# Patient Record
Sex: Male | Born: 1935 | Race: White | Hispanic: No | Marital: Married
Health system: Southern US, Community
[De-identification: ages and names within clinical notes are randomized; demographics above are authoritative.]

## PROBLEM LIST (undated history)

## (undated) DIAGNOSIS — I10 Essential (primary) hypertension: Secondary | ICD-10-CM

## (undated) DIAGNOSIS — I2510 Atherosclerotic heart disease of native coronary artery without angina pectoris: Secondary | ICD-10-CM

## (undated) DIAGNOSIS — Z95820 Peripheral vascular angioplasty status with implants and grafts: Secondary | ICD-10-CM

## (undated) DIAGNOSIS — I951 Orthostatic hypotension: Secondary | ICD-10-CM

## (undated) DIAGNOSIS — F0390 Unspecified dementia without behavioral disturbance: Secondary | ICD-10-CM

## (undated) DIAGNOSIS — S72009A Fracture of unspecified part of neck of unspecified femur, initial encounter for closed fracture: Secondary | ICD-10-CM

## (undated) HISTORY — PX: CORONARY STENT PLACEMENT: SHX1402

---

## 2011-12-22 ENCOUNTER — Emergency Department (HOSPITAL_COMMUNITY)
Admission: EM | Admit: 2011-12-22 | Discharge: 2011-12-22 | Disposition: A | Discharge status: Home / Self Care | Payer: Medicare HMO | Attending: Emergency Medicine | Admitting: Emergency Medicine

## 2011-12-22 ENCOUNTER — Emergency Department (HOSPITAL_COMMUNITY): Payer: Medicare HMO

## 2011-12-22 ENCOUNTER — Encounter (HOSPITAL_COMMUNITY): Payer: Self-pay | Admitting: *Deleted

## 2011-12-22 DIAGNOSIS — Z7982 Long term (current) use of aspirin: Secondary | ICD-10-CM | POA: Insufficient documentation

## 2011-12-22 DIAGNOSIS — I10 Essential (primary) hypertension: Secondary | ICD-10-CM | POA: Insufficient documentation

## 2011-12-22 DIAGNOSIS — Z9889 Other specified postprocedural states: Secondary | ICD-10-CM | POA: Insufficient documentation

## 2011-12-22 DIAGNOSIS — I2510 Atherosclerotic heart disease of native coronary artery without angina pectoris: Secondary | ICD-10-CM | POA: Insufficient documentation

## 2011-12-22 DIAGNOSIS — Z79899 Other long term (current) drug therapy: Secondary | ICD-10-CM | POA: Insufficient documentation

## 2011-12-22 DIAGNOSIS — I4891 Unspecified atrial fibrillation: Secondary | ICD-10-CM | POA: Insufficient documentation

## 2011-12-22 DIAGNOSIS — I480 Paroxysmal atrial fibrillation: Secondary | ICD-10-CM

## 2011-12-22 DIAGNOSIS — R002 Palpitations: Secondary | ICD-10-CM | POA: Insufficient documentation

## 2011-12-22 HISTORY — DX: Atherosclerotic heart disease of native coronary artery without angina pectoris: I25.10

## 2011-12-22 HISTORY — DX: Essential (primary) hypertension: I10

## 2011-12-22 LAB — CBC
MCV: 89.7 fL (ref 78.0–100.0)
Platelets: 209 10*3/uL (ref 150–400)
RBC: 4.45 MIL/uL (ref 4.22–5.81)
RDW: 12.5 % (ref 11.5–15.5)
WBC: 8 10*3/uL (ref 4.0–10.5)

## 2011-12-22 LAB — POCT I-STAT, CHEM 8
Chloride: 106 mEq/L (ref 96–112)
Glucose, Bld: 137 mg/dL — ABNORMAL HIGH (ref 70–99)
HCT: 39 % (ref 39.0–52.0)
Hemoglobin: 13.3 g/dL (ref 13.0–17.0)
Potassium: 3.8 mEq/L (ref 3.5–5.1)
Sodium: 143 mEq/L (ref 135–145)

## 2011-12-22 LAB — DIGOXIN LEVEL: Digoxin Level: <0.3 ng/mL — ABNORMAL LOW (ref 0.8–2.0)

## 2011-12-22 MED ORDER — DILTIAZEM HCL 60 MG PO TABS: 60.0000 mg | ORAL_TABLET | Freq: Once | ORAL | Status: AC

## 2011-12-22 MED ORDER — DILTIAZEM HCL 60 MG PO TABS
60.0000 mg | ORAL_TABLET | Freq: Once | ORAL | Status: AC
  Administered 2011-12-22: 60 mg via ORAL
  Filled 2011-12-22: qty 1

## 2011-12-22 NOTE — ED Notes (Signed)
Pt states he went to bed and felt his heart racing and ears pounding. Denies chest pain, nausea and vomiting. Pt has a history of SVT and ? Afib. Pt from [facility]. Pt denies pain or palpitations at this time. Denies pounding in ears at this time, too. Pt is alert and oriented x 4, neuro intact. Pt NSR on 12 lead per [facility].

## 2011-12-22 NOTE — ED Notes (Signed)
Pt resting quietly with no s/s of any pain or distress observed or reported. Family remains at bedside, and pt is awaiting MD re-evaluation for disposition. Will continue to monitor pt.

## 2011-12-22 NOTE — ED Notes (Signed)
Pt denies any pain or questions upon discharge. 

## 2011-12-22 NOTE — ED Provider Notes (Signed)
History     CSN: [id number]  Arrival date & time 12/22/11  0142   First MD Initiated Contact with Patient 12/22/11 0146      Chief Complaint  Patient presents with  . Palpitations    (Consider location/radiation/quality/duration/timing/severity/associated sxs/prior treatment) HPI history provided by patient. Is from [facility] here visiting family. He has a history of coronary artery disease and hypertension status post coronary stent placement. He has history of paroxysmal atrial fibrillation. He had this one time in the past, went to the hospital and received an IV drip with medication to convert him back to normal sinus rhythm within minutes. He is not on Coumadin or blood thinner other than a baby aspirin. He is prescribed digoxin and Florinef. Tonight when he lay down to sleep developed palpitations that felt the same as his previous episode of atrial fibrillation. The symptoms lasted until he got up and asked family member to call 911. At no time did he have any chest pain or difficulty breathing. He denies any medication changes. No recent illness. No change in diet including caffeinated beverages. He remains asymptomatic in the ED. No diaphoresis. No nausea vomiting. No back pain. No headache, numbness or weakness. Symptoms moderate in severity now resolved. Therapist so that lasted 20-30 minutes. Past Medical History  Diagnosis Date  . Coronary artery disease   . Hypertension     Past Surgical History  Procedure Date  . Coronary stent placement     No family history on file.  History  Substance Use Topics  . Smoking status: Not on file  . Smokeless tobacco: Not on file  . Alcohol Use:       Review of Systems  Constitutional: Negative for fever and chills.  HENT: Negative for neck pain and neck stiffness.   Eyes: Negative for pain.  Respiratory: Negative for shortness of breath.   Cardiovascular: Positive for palpitations. Negative for chest pain.    Gastrointestinal: Negative for abdominal pain.  Genitourinary: Negative for dysuria.  Musculoskeletal: Negative for back pain.  Skin: Negative for rash.  Neurological: Negative for headaches.  All other systems reviewed and are negative.    Allergies  Review of patient's allergies indicates no known allergies.  Home Medications   Current Outpatient Rx  Name  Route  Sig  Dispense  Refill  . ACETAMINOPHEN 500 MG PO TABS   Oral   Take 500-1,000 mg by mouth every 6 (six) hours as needed. For pain         . ASPIRIN EC 81 MG PO TBEC   Oral   Take 81 mg by mouth daily.         Marland Kitchen DIGOXIN 0.125 MG PO TABS   Oral   Take 0.125 mg by mouth daily.         Marland Kitchen FLUDROCORTISONE ACETATE 0.1 MG PO TABS   Oral   Take 0.1 mg by mouth 2 (two) times daily.         Marland Kitchen POTASSIUM CHLORIDE 20 MEQ/15ML (10%) PO LIQD   Oral   Take 10 mEq by mouth daily. 5 ml         . VITAMIN D (ERGOCALCIFEROL) 50000 UNITS PO CAPS   Oral   Take 50,000 Units by mouth daily.           BP 163/110  Pulse 89  Temp 97.7 F (36.5 C) (Oral)  Resp 21  SpO2 100%  Physical Exam  Constitutional: He is oriented to person, place,  and time. He appears well-developed and well-nourished.  HENT:  Head: Normocephalic and atraumatic.  Eyes: Conjunctivae normal and EOM are normal. Pupils are equal, round, and reactive to light.  Neck: Trachea normal. Neck supple. No thyromegaly present.  Cardiovascular: Normal rate, regular rhythm, S1 normal, S2 normal and normal pulses.     No systolic murmur is present   No diastolic murmur is present  Pulses:      Radial pulses are 2+ on the right side, and 2+ on the left side.  Pulmonary/Chest: Effort normal and breath sounds normal. No stridor. He has no wheezes. He has no rhonchi. He has no rales. He exhibits no tenderness.  Abdominal: Soft. Normal appearance and bowel sounds are normal. There is no tenderness. There is no CVA tenderness and negative Murphy's sign.   Musculoskeletal:       BLE:s Calves nontender, no cords or erythema, negative Homans sign  Neurological: He is alert and oriented to person, place, and time. He has normal strength. No cranial nerve deficit or sensory deficit. GCS eye subscore is 4. GCS verbal subscore is 5. GCS motor subscore is 6.  Skin: Skin is warm and dry. No rash noted. He is not diaphoretic.  Psychiatric: His speech is normal.       Cooperative and appropriate    ED Course  Procedures (including critical care time)  Results for orders placed during the hospital encounter of 12/22/11  CBC      Component Value Range   WBC 8.0  4.0 - 10.5 K/uL   RBC 4.45  4.22 - 5.81 MIL/uL   Hemoglobin 13.3  13.0 - 17.0 g/dL   HCT 16.1  09.6 - 04.5 %   MCV 89.7  78.0 - 100.0 fL   MCH 29.9  26.0 - 34.0 pg   MCHC 33.3  30.0 - 36.0 g/dL   RDW 40.9  81.1 - 91.4 %   Platelets 209  150 - 400 K/uL  DIGOXIN LEVEL      Component Value Range   Digoxin Level <0.3 (*) 0.8 - 2.0 ng/mL  POCT I-STAT, CHEM 8      Component Value Range   Sodium 143  135 - 145 mEq/L   Potassium 3.8  3.5 - 5.1 mEq/L   Chloride 106  96 - 112 mEq/L   BUN 21  6 - 23 mg/dL   Creatinine, Ser 7.82  0.50 - 1.35 mg/dL   Glucose, Bld 956 (*) 70 - 99 mg/dL   Calcium, Ion 2.13  0.86 - 1.30 mmol/L   TCO2 23  0 - 100 mmol/L   Hemoglobin 13.3  13.0 - 17.0 g/dL   HCT 57.8  46.9 - 62.9 %   Dg Chest Portable 1 View  12/22/2011  *RADIOLOGY REPORT*  Clinical Data: Palpitations.  PORTABLE CHEST - 1 VIEW  Comparison: None.  Findings: The lungs are well-aerated and clear.  There is no evidence of focal opacification, pleural effusion or pneumothorax. The left costophrenic angle is incompletely imaged on this study.  The cardiomediastinal silhouette is within normal limits.  No acute osseous abnormalities are seen.  IMPRESSION: No acute cardiopulmonary process seen.   Original Report Authenticated By: Tonia Ghent, M.D.     Rhythm strips obtained by EMS reviewed normal  sinus rhythm. Patient states asymptomatic at that time   Date: 12/22/2011  Rate: 79  Rhythm: normal sinus rhythm  QRS Axis: normal  Intervals: normal  ST/T Wave abnormalities: nonspecific ST changes  Conduction Disutrbances:none  Narrative Interpretation:   Old EKG Reviewed: none available  Diltiazem 60 mg given for elevated blood pressure and what sounds like an episode of paroxysmal atrial fibrillation.  Monitored in the emergency department, normal sinus rhythm throughout. Digoxin level noted and patient assures me he takes it daily as prescribed. He states he has had a procedure to evaluate his heart in the past with a history of paroxysmal atrial fibrillation.  He has never been prescribed blood thinners, beta blocker or calcium channel blocker.   3:59 AM patient ambulates and tolerates fluids remains asymptomatic in the ED. He and family are requesting prn prescription for diltiazem to take as needed for any return of symptoms. Strict return precautions verbalizes understood. Patient agrees to followup with his cardiologist on Monday for review of medications and recheck in the clinic. MDM   78 year old male with history of paroxysmal atrial fibrillation presenting with palpitations now resolved. EKG as above demonstrates normal sinus rhythm. Patient evaluated with labs and chest x-ray. Presentation does not suggest ACS. Medications provided as above. Patient remained on cardiac monitor while in the emergency department        Sunnie Nielsen, MD 12/22/11 [phone]

## 2015-12-27 DIAGNOSIS — I951 Orthostatic hypotension: Secondary | ICD-10-CM | POA: Insufficient documentation

## 2016-02-25 DIAGNOSIS — K922 Gastrointestinal hemorrhage, unspecified: Secondary | ICD-10-CM | POA: Insufficient documentation

## 2017-03-09 ENCOUNTER — Other Ambulatory Visit: Payer: Self-pay

## 2017-03-09 ENCOUNTER — Encounter (HOSPITAL_COMMUNITY): Payer: Self-pay | Admitting: Emergency Medicine

## 2017-03-09 ENCOUNTER — Emergency Department (HOSPITAL_COMMUNITY): Payer: Medicare HMO

## 2017-03-09 ENCOUNTER — Inpatient Hospital Stay (HOSPITAL_COMMUNITY)
Admission: EM | Admit: 2017-03-09 | Discharge: 2017-03-20 | DRG: 470 | Disposition: A | Discharge status: Skilled Nursing Facility | Payer: Medicare HMO | Attending: Internal Medicine | Admitting: Internal Medicine

## 2017-03-09 DIAGNOSIS — N179 Acute kidney failure, unspecified: Secondary | ICD-10-CM | POA: Diagnosis present

## 2017-03-09 DIAGNOSIS — D72828 Other elevated white blood cell count: Secondary | ICD-10-CM | POA: Diagnosis present

## 2017-03-09 DIAGNOSIS — Z66 Do not resuscitate: Secondary | ICD-10-CM | POA: Diagnosis present

## 2017-03-09 DIAGNOSIS — D696 Thrombocytopenia, unspecified: Secondary | ICD-10-CM | POA: Diagnosis not present

## 2017-03-09 DIAGNOSIS — E876 Hypokalemia: Secondary | ICD-10-CM | POA: Diagnosis present

## 2017-03-09 DIAGNOSIS — W010XXA Fall on same level from slipping, tripping and stumbling without subsequent striking against object, initial encounter: Secondary | ICD-10-CM | POA: Diagnosis present

## 2017-03-09 DIAGNOSIS — Y92129 Unspecified place in nursing home as the place of occurrence of the external cause: Secondary | ICD-10-CM

## 2017-03-09 DIAGNOSIS — G901 Familial dysautonomia [Riley-Day]: Secondary | ICD-10-CM | POA: Diagnosis present

## 2017-03-09 DIAGNOSIS — S72001A Fracture of unspecified part of neck of right femur, initial encounter for closed fracture: Secondary | ICD-10-CM

## 2017-03-09 DIAGNOSIS — W19XXXA Unspecified fall, initial encounter: Secondary | ICD-10-CM | POA: Diagnosis not present

## 2017-03-09 DIAGNOSIS — I10 Essential (primary) hypertension: Secondary | ICD-10-CM | POA: Diagnosis present

## 2017-03-09 DIAGNOSIS — I2510 Atherosclerotic heart disease of native coronary artery without angina pectoris: Secondary | ICD-10-CM | POA: Diagnosis present

## 2017-03-09 DIAGNOSIS — S72011A Unspecified intracapsular fracture of right femur, initial encounter for closed fracture: Secondary | ICD-10-CM | POA: Diagnosis not present

## 2017-03-09 DIAGNOSIS — D62 Acute posthemorrhagic anemia: Secondary | ICD-10-CM | POA: Diagnosis not present

## 2017-03-09 DIAGNOSIS — F05 Delirium due to known physiological condition: Secondary | ICD-10-CM | POA: Diagnosis present

## 2017-03-09 DIAGNOSIS — Z959 Presence of cardiac and vascular implant and graft, unspecified: Secondary | ICD-10-CM | POA: Diagnosis not present

## 2017-03-09 DIAGNOSIS — F0390 Unspecified dementia without behavioral disturbance: Secondary | ICD-10-CM | POA: Diagnosis present

## 2017-03-09 DIAGNOSIS — R1310 Dysphagia, unspecified: Secondary | ICD-10-CM | POA: Diagnosis present

## 2017-03-09 DIAGNOSIS — Z955 Presence of coronary angioplasty implant and graft: Secondary | ICD-10-CM | POA: Diagnosis not present

## 2017-03-09 DIAGNOSIS — Z96649 Presence of unspecified artificial hip joint: Secondary | ICD-10-CM

## 2017-03-09 DIAGNOSIS — I951 Orthostatic hypotension: Secondary | ICD-10-CM | POA: Diagnosis not present

## 2017-03-09 DIAGNOSIS — S72009A Fracture of unspecified part of neck of unspecified femur, initial encounter for closed fracture: Secondary | ICD-10-CM | POA: Diagnosis not present

## 2017-03-09 DIAGNOSIS — I471 Supraventricular tachycardia, unspecified: Secondary | ICD-10-CM | POA: Insufficient documentation

## 2017-03-09 DIAGNOSIS — M25551 Pain in right hip: Secondary | ICD-10-CM | POA: Diagnosis present

## 2017-03-09 DIAGNOSIS — Z7982 Long term (current) use of aspirin: Secondary | ICD-10-CM

## 2017-03-09 DIAGNOSIS — S72001D Fracture of unspecified part of neck of right femur, subsequent encounter for closed fracture with routine healing: Secondary | ICD-10-CM | POA: Diagnosis not present

## 2017-03-09 DIAGNOSIS — Z95820 Peripheral vascular angioplasty status with implants and grafts: Secondary | ICD-10-CM

## 2017-03-09 HISTORY — DX: Orthostatic hypotension: I95.1

## 2017-03-09 HISTORY — DX: Fracture of unspecified part of neck of unspecified femur, initial encounter for closed fracture: S72.009A

## 2017-03-09 HISTORY — DX: Atherosclerotic heart disease of native coronary artery without angina pectoris: I25.10

## 2017-03-09 HISTORY — DX: Peripheral vascular angioplasty status with implants and grafts: Z95.820

## 2017-03-09 HISTORY — DX: Essential (primary) hypertension: I10

## 2017-03-09 HISTORY — DX: Unspecified dementia, unspecified severity, without behavioral disturbance, psychotic disturbance, mood disturbance, and anxiety: F03.90

## 2017-03-09 HISTORY — DX: Unspecified dementia without behavioral disturbance: F03.90

## 2017-03-09 LAB — CBC WITH DIFFERENTIAL/PLATELET
Basophils Absolute: 0 10*3/uL (ref 0.0–0.1)
Basophils Relative: 0 %
EOS ABS: 0 10*3/uL (ref 0.0–0.7)
Eosinophils Relative: 0 %
HEMATOCRIT: 34.2 % — AB (ref 39.0–52.0)
HEMOGLOBIN: 11.2 g/dL — AB (ref 13.0–17.0)
LYMPHS ABS: 0.8 10*3/uL (ref 0.7–4.0)
LYMPHS PCT: 13 %
MCH: 31 pg (ref 26.0–34.0)
MCHC: 32.7 g/dL (ref 30.0–36.0)
MCV: 94.7 fL (ref 78.0–100.0)
MONOS PCT: 9 %
Monocytes Absolute: 0.6 10*3/uL (ref 0.1–1.0)
NEUTROS ABS: 5 10*3/uL (ref 1.7–7.7)
NEUTROS PCT: 78 %
Platelets: 184 10*3/uL (ref 150–400)
RBC: 3.61 MIL/uL — ABNORMAL LOW (ref 4.22–5.81)
RDW: 13 % (ref 11.5–15.5)
WBC: 6.4 10*3/uL (ref 4.0–10.5)

## 2017-03-09 LAB — TYPE AND SCREEN
ABO/RH(D): B POS
ANTIBODY SCREEN: NEGATIVE

## 2017-03-09 LAB — PROTIME-INR
INR: 0.99
Prothrombin Time: 13 seconds (ref 11.4–15.2)

## 2017-03-09 LAB — BASIC METABOLIC PANEL
Anion gap: 10 (ref 5–15)
BUN: 19 mg/dL (ref 6–20)
CHLORIDE: 107 mmol/L (ref 101–111)
CO2: 25 mmol/L (ref 22–32)
Calcium: 9.1 mg/dL (ref 8.9–10.3)
Creatinine, Ser: 1 mg/dL (ref 0.61–1.24)
GFR calc Af Amer: >60 mL/min (ref >60)
GFR calc non Af Amer: >60 mL/min (ref >60)
Glucose, Bld: 88 mg/dL (ref 65–99)
Potassium: 3.4 mmol/L — ABNORMAL LOW (ref 3.5–5.1)
SODIUM: 142 mmol/L (ref 135–145)

## 2017-03-09 LAB — SURGICAL PCR SCREEN
MRSA, PCR: NEGATIVE
Staphylococcus aureus: NEGATIVE

## 2017-03-09 LAB — ABO/RH: ABO/RH(D): B POS

## 2017-03-09 MED ORDER — MELATONIN 3 MG PO TABS
3.0000 mg | ORAL_TABLET | Freq: Every day | ORAL | Status: DC
  Administered 2017-03-09 – 2017-03-19 (×11): 3 mg via ORAL
  Filled 2017-03-09 (×11): qty 1

## 2017-03-09 MED ORDER — CHLORHEXIDINE GLUCONATE 4 % EX LIQD: 60.0000 mL | Freq: Once | CUTANEOUS | Status: DC

## 2017-03-09 MED ORDER — MIRTAZAPINE 15 MG PO TABS
15.0000 mg | ORAL_TABLET | Freq: Every day | ORAL | Status: DC
  Administered 2017-03-09 – 2017-03-13 (×5): 15 mg via ORAL
  Filled 2017-03-09 (×5): qty 1

## 2017-03-09 MED ORDER — POVIDONE-IODINE 10 % EX SWAB: 2.0000 "application " | Freq: Once | CUTANEOUS | Status: DC

## 2017-03-09 MED ORDER — HYDROMORPHONE HCL 1 MG/ML IJ SOLN
0.5000 mg | Freq: Once | INTRAMUSCULAR | Status: AC
  Administered 2017-03-09: 0.5 mg via INTRAVENOUS
  Filled 2017-03-09: qty 1

## 2017-03-09 MED ORDER — HYDROCODONE-ACETAMINOPHEN 5-325 MG PO TABS: 1.0000 | ORAL_TABLET | ORAL | Status: DC | PRN

## 2017-03-09 MED ORDER — METHOCARBAMOL 1000 MG/10ML IJ SOLN
500.0000 mg | Freq: Once | INTRAMUSCULAR | Status: DC
  Filled 2017-03-09: qty 5

## 2017-03-09 MED ORDER — CEFAZOLIN SODIUM-DEXTROSE 2-4 GM/100ML-% IV SOLN
2.0000 g | INTRAVENOUS | Status: AC
  Administered 2017-03-10: 2 g via INTRAVENOUS
  Filled 2017-03-09: qty 100

## 2017-03-09 MED ORDER — SODIUM CHLORIDE 0.9 % IV SOLN
INTRAVENOUS | Status: DC
  Administered 2017-03-09 – 2017-03-10 (×2): via INTRAVENOUS

## 2017-03-09 MED ORDER — POLYETHYLENE GLYCOL 3350 17 G PO PACK: 17.0000 g | PACK | Freq: Every day | ORAL | Status: DC | PRN

## 2017-03-09 MED ORDER — FENTANYL CITRATE (PF) 100 MCG/2ML IJ SOLN
50.0000 ug | Freq: Once | INTRAMUSCULAR | Status: AC
Start: 2017-03-09 — End: 2017-03-09
  Administered 2017-03-09: 50 ug via INTRAVENOUS
  Filled 2017-03-09: qty 2

## 2017-03-09 MED ORDER — ACETAMINOPHEN 325 MG PO TABS
650.0000 mg | ORAL_TABLET | Freq: Four times a day (QID) | ORAL | Status: DC | PRN
  Administered 2017-03-09 – 2017-03-19 (×10): 650 mg via ORAL
  Filled 2017-03-09 (×10): qty 2

## 2017-03-09 MED ORDER — SORBITOL 70 % SOLN
30.0000 mL | Freq: Every day | Status: DC | PRN
  Filled 2017-03-09: qty 30

## 2017-03-09 MED ORDER — ONDANSETRON HCL 4 MG/2ML IJ SOLN
4.0000 mg | Freq: Four times a day (QID) | INTRAMUSCULAR | Status: DC | PRN
Start: 2017-03-09 — End: 2017-03-20

## 2017-03-09 MED ORDER — HYDROCORTISONE NA SUCCINATE PF 100 MG IJ SOLR
50.0000 mg | Freq: Three times a day (TID) | INTRAMUSCULAR | Status: DC
  Administered 2017-03-09 – 2017-03-11 (×3): 50 mg via INTRAVENOUS
  Filled 2017-03-09 (×6): qty 1

## 2017-03-09 MED ORDER — FLUDROCORTISONE ACETATE 0.1 MG PO TABS
0.2000 mg | ORAL_TABLET | Freq: Every day | ORAL | Status: DC
  Administered 2017-03-10 – 2017-03-11 (×2): 0.2 mg via ORAL
  Filled 2017-03-09 (×5): qty 2

## 2017-03-09 MED ORDER — SODIUM CHLORIDE 0.9% FLUSH
3.0000 mL | Freq: Two times a day (BID) | INTRAVENOUS | Status: DC
  Administered 2017-03-09 – 2017-03-20 (×16): 3 mL via INTRAVENOUS

## 2017-03-09 MED ORDER — ACETAMINOPHEN 500 MG PO TABS
1000.0000 mg | ORAL_TABLET | ORAL | Status: AC
  Administered 2017-03-10: 1000 mg via ORAL
  Filled 2017-03-09: qty 2

## 2017-03-09 MED ORDER — ENOXAPARIN SODIUM 40 MG/0.4ML ~~LOC~~ SOLN
40.0000 mg | SUBCUTANEOUS | Status: DC
  Administered 2017-03-10 – 2017-03-19 (×10): 40 mg via SUBCUTANEOUS
  Filled 2017-03-09 (×10): qty 0.4

## 2017-03-09 MED ORDER — CEFAZOLIN SODIUM-DEXTROSE 2-4 GM/100ML-% IV SOLN: 2.0000 g | INTRAVENOUS | Status: DC

## 2017-03-09 MED ORDER — ACETAMINOPHEN 650 MG RE SUPP: 650.0000 mg | Freq: Four times a day (QID) | RECTAL | Status: DC | PRN

## 2017-03-09 MED ORDER — ASPIRIN EC 81 MG PO TBEC
81.0000 mg | DELAYED_RELEASE_TABLET | Freq: Every day | ORAL | Status: DC
Start: 2017-03-10 — End: 2017-03-20
  Administered 2017-03-11 – 2017-03-20 (×7): 81 mg via ORAL
  Filled 2017-03-09 (×9): qty 1

## 2017-03-09 MED ORDER — ACETAMINOPHEN 500 MG PO TABS: 1000.0000 mg | ORAL_TABLET | Freq: Once | ORAL | Status: DC

## 2017-03-09 MED ORDER — MEMANTINE HCL 10 MG PO TABS
5.0000 mg | ORAL_TABLET | Freq: Every day | ORAL | Status: DC
  Administered 2017-03-09 – 2017-03-19 (×11): 5 mg via ORAL
  Filled 2017-03-09 (×11): qty 1

## 2017-03-09 MED ORDER — KETOROLAC TROMETHAMINE 15 MG/ML IJ SOLN
15.0000 mg | Freq: Four times a day (QID) | INTRAMUSCULAR | Status: DC | PRN
Start: 2017-03-09 — End: 2017-03-14
  Administered 2017-03-09 – 2017-03-12 (×3): 15 mg via INTRAVENOUS
  Filled 2017-03-09 (×3): qty 1

## 2017-03-09 MED ORDER — ONDANSETRON HCL 4 MG PO TABS: 4.0000 mg | ORAL_TABLET | Freq: Four times a day (QID) | ORAL | Status: DC | PRN

## 2017-03-09 NOTE — Plan of Care (Signed)
Pt has dementia no family present

## 2017-03-09 NOTE — Progress Notes (Signed)
Spoke with [name] Ignacia PalmaDavidson patient niece and POA. Phone consent done with L Epperson RN for surgery 2/15 and for blood products if needed.  Sharrell Ku Jairo Bellew RN

## 2017-03-09 NOTE — ED Notes (Signed)
Pt in xray

## 2017-03-09 NOTE — ED Notes (Signed)
Bed: WA19 Expected date:  Expected time:  Means of arrival:  Comments: EMS fall/hip pain 

## 2017-03-09 NOTE — ED Provider Notes (Addendum)
[facility]-EMERGENCY DEPT Provider Note  CSN: [id number] Arrival date & time: 03/09/17 4098  Chief Complaint(s) Hip Pain and Fall  HPI Joe Mcpherson is a 84 y.o. male with a history of hypertension CAD with stenting who presents to the emergency department from skilled nursing facility by EMS after mechanical fall resulting in right hip pain.  Patient states that he slipped causing him to fall right onto his right hip.  Denies any head trauma or other injuries.  Currently complaining of right hip pain exacerbated with movement and palpation.  Had some mild relief in route when EMS provided the patient with fentanyl.  No other alleviating or aggravating factors.  Patient denies any headache, neck pain, back pain.  No other extremity pain.  No abdominal pain or chest pain.  Denies any other physical complaints.  HPI  Past Medical History Past Medical History:  Diagnosis Date  . Coronary artery disease   . Hypertension    There are no active problems to display for this patient.  Home Medication(s) Prior to Admission medications   Medication Sig Start Date End Date Taking? Authorizing Provider  aspirin EC 81 MG tablet Take 81 mg by mouth daily.   Yes [provider]  fludrocortisone (FLORINEF) 0.1 MG tablet Take 0.2 mg by mouth daily.    Yes [provider]  Melatonin 3 MG TABS Take 3 mg by mouth at bedtime.   Yes [provider]  memantine (NAMENDA) 5 MG tablet Take 5 mg by mouth at bedtime.   Yes [provider]  mirtazapine (REMERON) 15 MG tablet Take 15 mg by mouth at bedtime.   Yes [provider]  potassium chloride (K-DUR) 10 MEQ tablet Take 10 mEq by mouth daily.   Yes [provider]  diltiazem (CARDIZEM) 60 MG tablet Take 1 tablet (60 mg total) by mouth once. Patient not taking: Reported on 03/09/2017 12/22/11   Sunnie Nielsen, MD                                                       Past Surgical History Past Surgical History:  Procedure Laterality Date  . CORONARY STENT PLACEMENT     Family History No family history on file.  Social History Social History   Tobacco Use  . Smoking status: Not on file  Substance Use Topics  . Alcohol use: Not on file  . Drug use: Not on file   Allergies Patient has no known allergies.  Review of Systems Review of Systems All other systems are reviewed and are negative for acute change except as noted in the HPI  Physical Exam Vital Signs  I have reviewed the triage vital signs BP (!) 184/96 (BP Location: Left Arm)   Pulse 82   Temp (!) 97.4 F (36.3 C) (Oral)   Resp (!) 26   SpO2 97%   Physical Exam  Constitutional: He is oriented to person, place, and time. He appears well-developed and well-nourished. No distress.  HENT:  Head: Normocephalic.  Right Ear: External ear normal.  Left Ear: External ear normal.  Mouth/Throat: Oropharynx is clear and moist.  Eyes: Conjunctivae and EOM are normal. Pupils are equal, round, and reactive to light. Right eye exhibits no discharge. Left eye exhibits no discharge. No scleral icterus.  Neck: Normal range of  motion. Neck supple.  Cardiovascular: Regular rhythm and normal heart sounds. Exam reveals no gallop and no friction rub.  No murmur heard. Pulses:      Radial pulses are 2+ on the right side, and 2+ on the left side.       Dorsalis pedis pulses are 1+ on the right side, and 1+ on the left side.  Pulmonary/Chest: Effort normal and breath sounds normal. No stridor. No respiratory distress.  Abdominal: Soft. He exhibits no distension. There is no tenderness.  Musculoskeletal:       Right hip: He exhibits decreased range of motion, bony tenderness and deformity (internally rotated).       Cervical back: He exhibits no bony tenderness.       Thoracic back: He exhibits no bony tenderness.       Lumbar back: He exhibits  no bony tenderness.  Clavicle stable. Chest stable to AP/Lat compression. Pelvis stable to Lat compression.   Neurological: He is alert and oriented to person, place, and time. GCS eye subscore is 4. GCS verbal subscore is 5. GCS motor subscore is 6.  Moving all extremities   Skin: Skin is warm. He is not diaphoretic.    ED Results and Treatments Labs (all labs ordered are listed, but only abnormal results are displayed) Labs Reviewed  CBC WITH DIFFERENTIAL/PLATELET - Abnormal; Notable for the following components:      Result Value   RBC 3.61 (*)    Hemoglobin 11.2 (*)    HCT 34.2 (*)    All other components within normal limits  BASIC METABOLIC PANEL - Abnormal; Notable for the following components:   Potassium 3.4 (*)    All other components within normal limits  PROTIME-INR                                                                                                                         EKG  EKG Interpretation  Date/Time:    Ventricular Rate:    PR Interval:    QRS Duration:   QT Interval:    QTC Calculation:   R Axis:     Text Interpretation:        Radiology Dg Chest 1 View  Result Date: 03/09/2017 CLINICAL DATA:  The patient slipped and fell today and has diffuse right hip pain. EXAM: CHEST 1 VIEW COMPARISON:  Portable chest x-ray of December 22, 2011 FINDINGS: The lungs are hyperinflated. There is no focal infiltrate. The lung markings are increased however in the right upper lobe. There is scleroses of the posterior aspect of the right fourth rib which has progressed since the previous study. The heart is top-normal in size. The pulmonary vascularity is normal. There tortuosity of the descending thoracic aorta. No acute rib fracture is observed. IMPRESSION: COPD. Slightly increased right upper lobe lung markings as compared to the previous study may reflect atelectasis or early pneumonia. No CHF. Increased density within the posterior aspect of the right  fourth rib.  Thoracic aortic atherosclerosis. Electronically Signed   By: David  SwazilandJordan M.D.   On: 03/09/2017 10:25   Dg Hip Unilat  With Pelvis 2-3 Views Right  Result Date: 03/09/2017 CLINICAL DATA:  Status post fall today with diffuse right hip pain. EXAM: DG HIP (WITH OR WITHOUT PELVIS) 2-3V RIGHT COMPARISON:  None in PACs FINDINGS: Patient has sustained an acute subcapital fracture of the right hip. The right femoral head remains appropriately position with respect to the acetabulum. There is mild right hip joint space narrowing. The intertrochanteric and immediate subtrochanteric regions of the right hip are intact. 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 IMPRESSION: There is an acute subcapital fracture of the right hip. Electronically Signed   By: David  [place]azil[place]Jordan M.D.   On: 03/09/2017 10:26   Pertinent labs & imaging results that were available during my care of the patient were reviewed by me and considered in my medical decision making (see chart for details).  Medications Ordered in ED Medications  HYDROmorphone (DILAUDID) injection 0.5 mg (not administered)  methocarbamol (ROBAXIN) injection 500 mg (not administered)  fentaNYL (SUBLIMAZE) injection 50 mcg (50 mcg Intravenous Given 03/09/17 1045)                                                                                                                                    Procedures Procedures  (including critical care time)  Medical Decision Making / ED Course I have reviewed the nursing notes for this encounter and the patient's prior records (if available in EHR or on provided paperwork).    Mechanical fall resulting in right subcapital femoral neck fracture.  Patient has had a history of left hip fracture that was [place] in [place].  No other injuries noted on exam requiring imaging.  Screening labs are obtained.  Case was discussed with hospitalist will admit the patient for medical clearance.  Consulted  with Dr. Renaye Rakersim Murphy who [facility].  Final Clinical Impression(s) / ED Diagnoses Final diagnoses:  Fall, initial encounter  Closed displaced fracture of right femoral neck (HCC)      This chart was dictated using voice recognition software.  Despite best efforts to proofread,  errors can occur which can change the documentation meaning.     Nira Connardama, Tonya Wantz Eduardo, MD 03/09/17 1346

## 2017-03-09 NOTE — Consult Note (Signed)
Reason for Consult:Right hip fx Referring Physician: S Purohit  Joe Mcpherson is an 84 y.o. male.  HPI: Joe Mcpherson had a mechanical fall and was diagnosed with a right femoral neck fracture. Orthopedic surgery was consulted. He will not/cannot speak to me much like he didn't to admitting MD so history gleaned from chart. Unsure if this is baseline.  Past Medical History:  Diagnosis Date  . CAD (coronary artery disease) 03/09/2017  . Coronary artery disease   . Dementia 03/09/2017  . Hip fracture, unspecified laterality, closed, initial encounter ([place]) 03/09/2017  . HTN (hypertension) 03/09/2017  . Hypertension   . Orthostasis 03/09/2017  . S/P angioplasty with stent 03/09/2017    Past Surgical History:  Procedure Laterality Date  . CORONARY STENT PLACEMENT      No family history on file.  Social History:  has no tobacco, alcohol, and drug history on file.  Allergies: No Known Allergies  Medications: I have reviewed the patient's current medications.  Results for orders placed or performed during the hospital encounter of 03/09/17 (from the past 48 hour(s))  CBC with Differential/Platelet     Status: Abnormal   Collection Time: 03/09/17 10:38 AM  Result Value Ref Range   WBC 6.4 4.0 - 10.5 K/uL   RBC 3.61 (L) 4.22 - 5.81 MIL/uL   Hemoglobin 11.2 (L) 13.0 - 17.0 g/dL   HCT 34.2 (L) 39.0 - 52.0 %   MCV 94.7 78.0 - 100.0 fL   MCH 31.0 26.0 - 34.0 pg   MCHC 32.7 30.0 - 36.0 g/dL   RDW 13.0 11.5 - 15.5 %   Platelets 184 150 - 400 K/uL   Neutrophils Relative % 78 %   Neutro Abs 5.0 1.7 - 7.7 K/uL   Lymphocytes Relative 13 %   Lymphs Abs 0.8 0.7 - 4.0 K/uL   Monocytes Relative 9 %   Monocytes Absolute 0.6 0.1 - 1.0 K/uL   Eosinophils Relative 0 %   Eosinophils Absolute 0.0 0.0 - 0.7 K/uL   Basophils Relative 0 %   Basophils Absolute 0.0 0.0 - 0.1 K/uL    Comment: Performed at [facility], [place] [address], [place], [place] [address]  Basic metabolic panel      Status: Abnormal   Collection Time: 03/09/17 10:38 AM  Result Value Ref Range   Sodium 142 135 - 145 mmol/L   Potassium 3.4 (L) 3.5 - 5.1 mmol/L   Chloride 107 101 - 111 mmol/L   CO2 25 22 - 32 mmol/L   Glucose, Bld 88 65 - 99 mg/dL   BUN 19 6 - 20 mg/dL   Creatinine, Ser 1.00 0.61 - 1.24 mg/dL   Calcium 9.1 8.9 - 10.3 mg/dL   GFR calc non Af Amer >60 >60 mL/min   GFR calc Af Amer >60 >60 mL/min    Comment: (NOTE) The eGFR has been calculated using the CKD EPI equation. This calculation has not been validated in all clinical situations. eGFR's persistently <60 mL/min signify possible Chronic Kidney Disease.    Anion gap 10 5 - 15    Comment: Performed at [facility], [place] [address], [place], [place] [address]  Protime-INR     Status: None   Collection Time: 03/09/17 12:14 PM  Result Value Ref Range   Prothrombin Time 13.0 11.4 - 15.2 seconds   INR 0.99     Comment: Performed at [facility], [place] [address] [place], [place] [address]    Dg Chest  1 View  Result Date: 03/09/2017 CLINICAL DATA:  The patient slipped and fell today and has diffuse right hip pain. EXAM: CHEST 1 VIEW COMPARISON:  Portable chest x-ray of December 22, 2011 FINDINGS: The lungs are hyperinflated. There is no focal infiltrate. The lung markings are increased however in the right upper lobe. There is scleroses of the posterior aspect of the right fourth rib which has progressed since the previous study. The heart is top-normal in size. The pulmonary vascularity is normal. There tortuosity of the descending thoracic aorta. No acute rib fracture is observed. IMPRESSION: COPD. Slightly increased right upper lobe lung markings as compared to the previous study may reflect atelectasis or early pneumonia. No CHF. Increased density within the posterior aspect of the right fourth rib. Thoracic aortic atherosclerosis. Electronically Signed   By: David  Martinique M.D.   On: 03/09/2017  10:25   Dg Hip Unilat  With Pelvis 2-3 Views Right  Result Date: 03/09/2017 CLINICAL DATA:  Status post fall today with diffuse right hip pain. EXAM: DG HIP (WITH OR WITHOUT PELVIS) 2-3V RIGHT COMPARISON:  None in PACs FINDINGS: Patient has sustained an acute subcapital fracture of the right hip. The right femoral head remains appropriately position with respect to the acetabulum. There is mild right hip joint space narrowing. The intertrochanteric and immediate subtrochanteric regions of the right hip are intact. 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 IMPRESSION: There is an acute subcapital fracture of the right hip. Electronically Signed   By: David  Martinique M.D.   On: 03/09/2017 10:26    Review of Systems  Unable to perform ROS: Patient nonverbal   Blood pressure (!) 173/85, pulse 87, temperature (!) 97.4 F (36.3 C), temperature source Oral, resp. rate 18, SpO2 93 %. Physical Exam  Constitutional: He appears well-developed and well-nourished. No distress.  HENT:  Head: Normocephalic and atraumatic.  Eyes: Conjunctivae are normal. Right eye exhibits no discharge. Left eye exhibits no discharge. No scleral icterus.  Neck: Normal range of motion.  Cardiovascular: Normal rate and regular rhythm.  Respiratory: Effort normal. No respiratory distress.  Musculoskeletal:  BLE No traumatic wounds, ecchymosis, or rash  Nontender but grimaces with movement of right leg  No knee or ankle effusion  Knee stable to varus/ valgus and anterior/posterior stress  Sens DPN, SPN, TN could not assess  Motor EHL, ext, flex, evers could not assess  DP 2+, PT 0, No significant edema  Neurological: He is alert.  Skin: Skin is warm and dry. He is not diaphoretic.  Psychiatric: He has a normal mood and affect. His behavior is normal.    Assessment/Plan: Fall Right femoral neck fx -- Likely hemiarthroplasty tomorrow at [facility] with Dr. Percell Miller. NPO after MN. Multiple medical problems  -- per primary team    Lisette Abu, PA-C Orthopedic Surgery [phone] 03/09/2017, 2:08 PM

## 2017-03-09 NOTE — ED Triage Notes (Addendum)
[facility] complaint of right hip pain post fall; shortening and rotation noted. 100 mcg of fentanyl given en route with EMS; pain now 3/10. C collar in place.

## 2017-03-09 NOTE — ED Notes (Signed)
ED TO INPATIENT HANDOFF REPORT  Name/Age/Gender Joe Mcpherson 84 y.o. male  Code Status Code Status History    This patient does not have a recorded code status. Please follow your organizational policy for patients in this situation.      Home/SNF/Other Skilled nursing facility  Chief Complaint fall / hip pain   Level of Care/Admitting Diagnosis ED Disposition    ED Disposition Condition Comment   Admit  Hospital Area: [facility] [[id number]]  Level of Care: Med-Surg [16]  Diagnosis: Hip fracture requiring operative repair [facility]) [[id number]]  Admitting Physician: Cristy Folks [[id number]]  Attending Physician: Cristy Folks [phone]  Estimated length of stay: past midnight tomorrow  Certification:: I certify this patient will need inpatient services for at least 2 midnights  PT Class (Do Not Modify): Inpatient [101]  PT Acc Code (Do Not Modify): Private [1]       Medical History Past Medical History:  Diagnosis Date  . CAD (coronary artery disease) 03/09/2017  . Coronary artery disease   . Dementia 03/09/2017  . Hip fracture, unspecified laterality, closed, initial encounter ([place]) 03/09/2017  . HTN (hypertension) 03/09/2017  . Hypertension   . Orthostasis 03/09/2017  . S/P angioplasty with stent 03/09/2017    Allergies No Known Allergies  IV Location/Drains/Wounds Patient Lines/Drains/Airways Status   Active Line/Drains/Airways    Name:   Placement date:   Placement time:   Site:   Days:   Peripheral IV 12/22/11 Left Antecubital   12/22/11    0157    Antecubital   1904          Labs/Imaging Results for orders placed or performed during the hospital encounter of 03/09/17 (from the past 48 hour(s))  CBC with Differential/Platelet     Status: Abnormal   Collection Time: 03/09/17 10:38 AM  Result Value Ref Range   WBC 6.4 4.0 - 10.5 K/uL   RBC 3.61 (L) 4.22 - 5.81 MIL/uL   Hemoglobin 11.2 (L) 13.0 - 17.0 g/dL   HCT 34.2 (L) 39.0 - 52.0 %   MCV 94.7 78.0 - 100.0 fL   MCH 31.0 26.0 - 34.0 pg   MCHC 32.7 30.0 - 36.0 g/dL   RDW 13.0 11.5 - 15.5 %   Platelets 184 150 - 400 K/uL   Neutrophils Relative % 78 %   Neutro Abs 5.0 1.7 - 7.7 K/uL   Lymphocytes Relative 13 %   Lymphs Abs 0.8 0.7 - 4.0 K/uL   Monocytes Relative 9 %   Monocytes Absolute 0.6 0.1 - 1.0 K/uL   Eosinophils Relative 0 %   Eosinophils Absolute 0.0 0.0 - 0.7 K/uL   Basophils Relative 0 %   Basophils Absolute 0.0 0.0 - 0.1 K/uL    Comment: Performed at [facility], [place] [address], [place],  [address]  Basic metabolic panel     Status: Abnormal   Collection Time: 03/09/17 10:38 AM  Result Value Ref Range   Sodium 142 135 - 145 mmol/L   Potassium 3.4 (L) 3.5 - 5.1 mmol/L   Chloride 107 101 - 111 mmol/L   CO2 25 22 - 32 mmol/L   Glucose, Bld 88 65 - 99 mg/dL   BUN 19 6 - 20 mg/dL   Creatinine, Ser 1.00 0.61 - 1.24 mg/dL   Calcium 9.1 8.9 - 10.3 mg/dL   GFR calc non Af Amer >60 >60 mL/min   GFR calc Af Amer >60 >60 mL/min    Comment: (NOTE) The  eGFR has been calculated using the CKD EPI equation. This calculation has not been validated in all clinical situations. eGFR's persistently <60 mL/min signify possible Chronic Kidney Disease.    Anion gap 10 5 - 15    Comment: Performed at [facility], [place] [address] [place], [place] [address]  Protime-INR     Status: None   Collection Time: 03/09/17 12:14 PM  Result Value Ref Range   Prothrombin Time 13.0 11.4 - 15.2 seconds   INR 0.99     Comment: Performed at [facility], [place] [address], [place], [place] [address]   Dg Chest 1 View  Result Date: 03/09/2017 CLINICAL DATA:  The patient slipped and fell today and has diffuse right hip pain. EXAM: CHEST 1 VIEW COMPARISON:  Portable chest x-ray of December 22, 2011 FINDINGS: The lungs are hyperinflated. There is no focal infiltrate. The lung markings are increased however in the right upper  lobe. There is scleroses of the posterior aspect of the right fourth rib which has progressed since the previous study. The heart is top-normal in size. The pulmonary vascularity is normal. There tortuosity of the descending thoracic aorta. No acute rib fracture is observed. IMPRESSION: COPD. Slightly increased right upper lobe lung markings as compared to the previous study may reflect atelectasis or early pneumonia. No CHF. Increased density within the posterior aspect of the right fourth rib. Thoracic aortic atherosclerosis. Electronically Signed   By: David  Martinique M.D.   On: 03/09/2017 10:25   Dg Hip Unilat  With Pelvis 2-3 Views Right  Result Date: 03/09/2017 CLINICAL DATA:  Status post fall today with diffuse right hip pain. EXAM: DG HIP (WITH OR WITHOUT PELVIS) 2-3V RIGHT COMPARISON:  None in PACs FINDINGS: Patient has sustained an acute subcapital fracture of the right hip. The right femoral head remains appropriately position with respect to the acetabulum. There is mild right hip joint space narrowing. The intertrochanteric and immediate subtrochanteric regions of the right hip are intact. 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 IMPRESSION: There is an acute subcapital fracture of the right hip. Electronically Signed   By: David  Martinique M.D.   On: 03/09/2017 10:26    Pending Labs [employer] (From admission, onward)   Start     Ordered   Signed and Held  CBC  Tomorrow morning,   R     Signed and Held   Signed and Held  Protime-INR  Tomorrow morning,   R     Signed and Held   Signed and Held  APTT  Tomorrow morning,   R     Signed and Held   Signed and Held  Basic metabolic panel  Tomorrow morning,   R     Signed and Held      Vitals/Pain Today's Vitals   03/09/17 1223 03/09/17 1249 03/09/17 1249 03/09/17 1330  BP: (!) 156/119   (!) 173/85  Pulse: 93 94  87  Resp: _0 Temp:      TempSrc:      SpO2: 90% 93%  93%  PainSc:   Asleep     Isolation  Precautions No active isolations  Medications Medications  methocarbamol (ROBAXIN) injection 500 mg (not administered)  fentaNYL (SUBLIMAZE) injection 50 mcg (50 mcg Intravenous Given 03/09/17 1045)  HYDROmorphone (DILAUDID) injection 0.5 mg (0.5 mg Intravenous Given 03/09/17 1214)    Mobility walks with device

## 2017-03-09 NOTE — Progress Notes (Signed)
PT Cancellation Note  Patient Details Name: Joe SignsRobert Mcpherson MRN: [id number] DOB: 15-Dec-1924   Cancelled Treatment:     PT order received but eval deferred - Likely [facility]arthroplasty tomorrow at [facility] with Dr. Eulah PontMurphy.  Please reorder post op.     Ona Rathert 03/09/2017, 4:01 PM

## 2017-03-09 NOTE — H&P (View-Only) (Signed)
Reason for Consult:Right hip fx Referring Physician: S Purohit  Joe Mcpherson is an 84 y.o. male.  HPI: Joe Mcpherson had a mechanical fall and was diagnosed with a right femoral neck fracture. Orthopedic surgery was consulted. He will not/cannot speak to me much like he didn't to admitting MD so history gleaned from chart. Unsure if this is baseline.  Past Medical History:  Diagnosis Date  . CAD (coronary artery disease) 03/09/2017  . Coronary artery disease   . Dementia 03/09/2017  . Hip fracture, unspecified laterality, closed, initial encounter (H[facility]) 03/09/2017  . HTN (hypertension) 03/09/2017  . Hypertension   . Orthostasis 03/09/2017  . S/P angioplasty with stent 03/09/2017    Past Surgical History:  Procedure Laterality Date  . CORONARY STENT PLACEMENT      No family history on file.  Social History:  has no tobacco, alcohol, and drug history on file.  Allergies: No Known Allergies  Medications: I have reviewed the patient's current medications.  Results for orders placed or performed during the hospital encounter of 03/09/17 (from the past 48 hour(s))  CBC with Differential/Platelet     Status: Abnormal   Collection Time: 03/09/17 10:38 AM  Result Value Ref Range   WBC 6.4 4.0 - 10.5 K/uL   RBC 3.61 (L) 4.22 - 5.81 MIL/uL   Hemoglobin 11.2 (L) 13.0 - 17.0 g/dL   HCT 34.2 (L) 39.0 - 52.0 %   MCV 94.7 78.0 - 100.0 fL   MCH 31.0 26.0 - 34.0 pg   MCHC 32.7 30.0 - 36.0 g/dL   RDW 13.0 11.5 - 15.5 %   Platelets 184 150 - 400 K/uL   Neutrophils Relative % 78 %   Neutro Abs 5.0 1.7 - 7.7 K/uL   Lymphocytes Relative 13 %   Lymphs Abs 0.8 0.7 - 4.0 K/uL   Monocytes Relative 9 %   Monocytes Absolute 0.6 0.1 - 1.0 K/uL   Eosinophils Relative 0 %   Eosinophils Absolute 0.0 0.0 - 0.7 K/uL   Basophils Relative 0 %   Basophils Absolute 0.0 0.0 - 0.1 K/uL    Comment: Performed at [facility], [address] [place], [place] [address]  Basic metabolic panel      Status: Abnormal   Collection Time: 03/09/17 10:38 AM  Result Value Ref Range   Sodium 142 135 - 145 mmol/L   Potassium 3.4 (L) 3.5 - 5.1 mmol/L   Chloride 107 101 - 111 mmol/L   CO2 25 22 - 32 mmol/L   Glucose, Bld 88 65 - 99 mg/dL   BUN 19 6 - 20 mg/dL   Creatinine, Ser 1.00 0.61 - 1.24 mg/dL   Calcium 9.1 8.9 - 10.3 mg/dL   GFR calc non Af Amer >60 >60 mL/min   GFR calc Af Amer >60 >60 mL/min    Comment: (NOTE) The eGFR has been calculated using the CKD EPI equation. This calculation has not been validated in all clinical situations. eGFR's persistently <60 mL/min signify possible Chronic Kidney Disease.    Anion gap 10 5 - 15    Comment: Performed at [facility], [address] [place], [place] [address]  Protime-INR     Status: None   Collection Time: 03/09/17 12:14 PM  Result Value Ref Range   Prothrombin Time 13.0 11.4 - 15.2 seconds   INR 0.99     Comment: Performed at [facility], [address] [place], [place] [address]    Dg Chest   1 View  Result Date: 03/09/2017 CLINICAL DATA:  The patient slipped and fell today and has diffuse right hip pain. EXAM: CHEST 1 VIEW COMPARISON:  Portable chest x-ray of December 22, 2011 FINDINGS: The lungs are hyperinflated. There is no focal infiltrate. The lung markings are increased however in the right upper lobe. There is scleroses of the posterior aspect of the right fourth rib which has progressed since the previous study. The heart is top-normal in size. The pulmonary vascularity is normal. There tortuosity of the descending thoracic aorta. No acute rib fracture is observed. IMPRESSION: COPD. Slightly increased right upper lobe lung markings as compared to the previous study may reflect atelectasis or early pneumonia. No CHF. Increased density within the posterior aspect of the right fourth rib. Thoracic aortic atherosclerosis. Electronically Signed   By: David  Jordan M.D.   On: 03/09/2017  10:25   Dg Hip Unilat  With Pelvis 2-3 Views Right  Result Date: 03/09/2017 CLINICAL DATA:  Status post fall today with diffuse right hip pain. EXAM: DG HIP (WITH OR WITHOUT PELVIS) 2-3V RIGHT COMPARISON:  None in PACs FINDINGS: Patient has sustained an acute subcapital fracture of the right hip. The right femoral head remains appropriately position with respect to the acetabulum. There is mild right hip joint space narrowing. The intertrochanteric and immediate subtrochanteric regions of the right hip are intact. 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 IMPRESSION: There is an acute subcapital fracture of the right hip. Electronically Signed   By: David  Jordan M.D.   On: 03/09/2017 10:26    Review of Systems  Unable to perform ROS: Patient nonverbal   Blood pressure (!) 173/85, pulse 87, temperature (!) 97.4 F (36.3 C), temperature source Oral, resp. rate 18, SpO2 93 %. Physical Exam  Constitutional: He appears well-developed and well-nourished. No distress.  HENT:  Head: Normocephalic and atraumatic.  Eyes: Conjunctivae are normal. Right eye exhibits no discharge. Left eye exhibits no discharge. No scleral icterus.  Neck: Normal range of motion.  Cardiovascular: Normal rate and regular rhythm.  Respiratory: Effort normal. No respiratory distress.  Musculoskeletal:  BLE No traumatic wounds, ecchymosis, or rash  Nontender but grimaces with movement of right leg  No knee or ankle effusion  Knee stable to varus/ valgus and anterior/posterior stress  Sens DPN, SPN, TN could not assess  Motor EHL, ext, flex, evers could not assess  DP 2+, PT 0, No significant edema  Neurological: He is alert.  Skin: Skin is warm and dry. He is not diaphoretic.  Psychiatric: He has a normal mood and affect. His behavior is normal.    Assessment/Plan: Fall Right femoral neck fx -- Likely hemiarthroplasty tomorrow at [place] with Dr. Murphy. NPO after MN. Multiple medical problems  -- per primary team    Tarren Velardi J. Calaya Gildner, PA-C Orthopedic Surgery [phone] 03/09/2017, 2:08 PM  

## 2017-03-09 NOTE — H&P (Signed)
History and Physical    Joe Mcpherson ZOX:[id number] DOB: 06-Apr-1924 DOA: 03/09/2017  PCP: Default, Provider, MD   Patient coming from: Nursing facility  Chief Complaint: Fall  HPI: Joe Mcpherson is a 84 y.o. male with medical history significant of orthostatic hypotension on aldosterone antagonist, coronary artery disease status post stenting, SVT, dementia, hypertension who comes in after having a mechanical fall onto his right hip.  Patient cannot give me any history as he refuses to speak to this provider.  He cannot tell me why he refuses to speak.  He simply closes eyes and refuses to communicate any of his history, symptoms.  Entire history is obtained from ED provider, electronic chart.  Patient apparently had a mechanical fall.  He denied any head trauma or other injuries at that time.  He denies any chest pain, syncope, palpitations, nausea vomiting, diarrhea.  He did not have any headache, neck pain, back pain.  He not have any abdominal pain.  ED Course: Vitals were notable for mild hypertension.  Labs were fairly unremarkable.  X-ray of the right hip showed acute subcapital fracture.  Review of Systems: As per HPI otherwise 10 point review of systems negative.     Past Medical History:  Diagnosis Date  . CAD (coronary artery disease) 03/09/2017  . Coronary artery disease   . Dementia 03/09/2017  . Hip fracture, unspecified laterality, closed, initial encounter ([facility]) 03/09/2017  . HTN (hypertension) 03/09/2017  . Hypertension   . Orthostasis 03/09/2017  . S/P angioplasty with stent 03/09/2017    Past Surgical History:  Procedure Laterality Date  . CORONARY STENT PLACEMENT       has no tobacco, alcohol, and drug history on file.  No Known Allergies  No family history on file. Unacceptable: Noncontributory, unremarkable, or negative. Acceptable: Family history reviewed and not pertinent (If you reviewed it)  Prior to Admission medications   Medication Sig Start Date End  Date Taking? Authorizing Provider  aspirin EC 81 MG tablet Take 81 mg by mouth daily.   Yes [provider]  fludrocortisone (FLORINEF) 0.1 MG tablet Take 0.2 mg by mouth daily.    Yes [provider]  Melatonin 3 MG TABS Take 3 mg by mouth at bedtime.   Yes [provider]  memantine (NAMENDA) 5 MG tablet Take 5 mg by mouth at bedtime.   Yes [provider]  mirtazapine (REMERON) 15 MG tablet Take 15 mg by mouth at bedtime.   Yes [provider]  potassium chloride (K-DUR) 10 MEQ tablet Take 10 mEq by mouth daily.   Yes [provider]  diltiazem (CARDIZEM) 60 MG tablet Take 1 tablet (60 mg total) by mouth once. Patient not taking: Reported on 03/09/2017 12/22/11   Sunnie Nielsen, MD    Physical Exam: Vitals:   03/09/17 1040 03/09/17 1223 03/09/17 1249  BP: (!) 184/96 (!) 156/119   Pulse: 82 93 94  Resp: (!) 26 15 14   Temp: (!) 97.4 F (36.3 C)    TempSrc: Oral    SpO2: 97% 90% 93%    Constitutional: NAD, calm, comfortable Vitals:   03/09/17 1040 03/09/17 1223 03/09/17 1249  BP: (!) 184/96 (!) 156/119   Pulse: 82 93 94  Resp: (!) 26 15 14   Temp: (!) 97.4 F (36.3 C)    TempSrc: Oral    SpO2: 97% 90% 93%   Eyes: Anicteric sclera ENMT: Dry mucous membranes.  Neck: normal, supple Respiratory: clear to auscultation bilaterally, no wheezing, no crackles.  Normal respiratory effort. No accessory muscle use.  Cardiovascular: Mild tachycardia, regular rhythm, no murmurs Abdomen: no tenderness, no masses palpated. No hepatosplenomegaly. Bowel sounds positive.  Musculoskeletal: Right hip is exquisitely tender to palpation, intact distal pulses.  Skin: Some abrasions noted Neurologic: Grossly intact, moving all extremities Psychiatric: Patient refusing to communicate, only complaining that he cannot discuss anything right now  (Anything < 9 systems with 2 bullets each down codes to level 1) (If patient refuses exam can't bill  higher level) (Make sure to document decubitus ulcers present on admission -- if possible -- and whether patient has chronic indwelling catheter at time of admission)  Labs on Admission: I have personally reviewed following labs and imaging studies  CBC: Recent Labs  Lab 03/09/17 1038  WBC 6.4  NEUTROABS 5.0  HGB 11.2*  HCT 34.2*  MCV 94.7  PLT 184   Basic Metabolic Panel: Recent Labs  Lab 03/09/17 1038  NA 142  K 3.4*  CL 107  CO2 25  GLUCOSE 88  BUN 19  CREATININE 1.00  CALCIUM 9.1   GFR: CrCl cannot be calculated (Unknown ideal weight.). Liver Function Tests: No results for input(s): AST, ALT, ALKPHOS, BILITOT, PROT, ALBUMIN in the last 168 hours. No results for input(s): LIPASE, AMYLASE in the last 168 hours. No results for input(s): AMMONIA in the last 168 hours. Coagulation Profile: Recent Labs  Lab 03/09/17 1214  INR 0.99   Cardiac Enzymes: No results for input(s): CKTOTAL, CKMB, CKMBINDEX, TROPONINI in the last 168 hours. BNP (last 3 results) No results for input(s): PROBNP in the last 8760 hours. HbA1C: No results for input(s): HGBA1C in the last 72 hours. CBG: No results for input(s): GLUCAP in the last 168 hours. Lipid Profile: No results for input(s): CHOL, HDL, LDLCALC, TRIG, CHOLHDL, LDLDIRECT in the last 72 hours. Thyroid Function Tests: No results for input(s): TSH, T4TOTAL, FREET4, T3FREE, THYROIDAB in the last 72 hours. Anemia Panel: No results for input(s): VITAMINB12, FOLATE, FERRITIN, TIBC, IRON, RETICCTPCT in the last 72 hours. Urine analysis: No results found for: COLORURINE, APPEARANCEUR, LABSPEC, PHURINE, GLUCOSEU, HGBUR, BILIRUBINUR, KETONESUR, PROTEINUR, UROBILINOGEN, NITRITE, LEUKOCYTESUR  Radiological Exams on Admission: Dg Chest 1 View  Result Date: 03/09/2017 CLINICAL DATA:  The patient slipped and fell today and has diffuse right hip pain. EXAM: CHEST 1 VIEW COMPARISON:  Portable chest x-ray of December 22, 2011 FINDINGS:  The lungs are hyperinflated. There is no focal infiltrate. The lung markings are increased however in the right upper lobe. There is scleroses of the posterior aspect of the right fourth rib which has progressed since the previous study. The heart is top-normal in size. The pulmonary vascularity is normal. There tortuosity of the descending thoracic aorta. No acute rib fracture is observed. IMPRESSION: COPD. Slightly increased right upper lobe lung markings as compared to the previous study may reflect atelectasis or early pneumonia. No CHF. Increased density within the posterior aspect of the right fourth rib. Thoracic aortic atherosclerosis. Electronically Signed   By: David  SwazilandJordan M.D.   On: 03/09/2017 10:25   Dg Hip Unilat  With Pelvis 2-3 Views Right  Result Date: 03/09/2017 CLINICAL DATA:  Status post fall today with diffuse right hip pain. EXAM: DG HIP (WITH OR WITHOUT PELVIS) 2-3V RIGHT COMPARISON:  None in PACs FINDINGS: Patient has sustained an acute subcapital fracture of the right hip. The right femoral head remains appropriately position with respect to the acetabulum. There is mild right hip joint space narrowing. The intertrochanteric and immediate  subtrochanteric regions of the right hip are intact. 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 IMPRESSION: There is an acute subcapital fracture of the right hip. Electronically Signed   By: David  [place] M.D.   On: 03/09/2017 10:26    EKG: Independently reviewed.  Sinus tachycardia no acute ST segment changes  Assessment/Plan Principal Problem:   Hip fracture requiring operative repair [facility]) Active Problems:   Hip fracture, unspecified laterality, closed, initial encounter (HCC)   CAD (coronary artery disease)   S/P angioplasty with stent   HTN (hypertension)   Dementia   Orthostasis    ##) Acute subcapital fracture of right hip: Appears to be in the setting of mechanical fall -Orthopedics consult -Toradol, Norco  for pain -Enoxaparin 40 mg prophylaxis  ##) Dysautonomia: Patient is an extremely poor historian.  There is no documentation in the chart of any autonomic disorder.  However patient is noted to be on fludrocortisone daily. -Continue fludrocortisone 0.2 mg daily -Perioperatively would start on IV hydrocortisone 50 mg every 8, plan to wean postoperatively  ##) Coronary artery disease status post stenting: Unable to confirm this history with the patient or any family members as no phone numbers in the chart work. -Continue aspirin 81 mg  ##) Psych: Patient has history of underlying dementia however unclear how severe.  Cannot assessment patient refuses to respond to any questions. -Continue mirtazapine 50 mg nightly -Continue memantine 5 mg daily  ##) History of SVT: Unable to elucidate further.  Patient was apparently on diltiazem at some point however currently is not   fluids: Gentle IV fluids Lateral eyes: Monitor and supplement Nutrition: N.p.o. at midnight  Prophylaxis: Enoxaparin  Disposition: Pending surgical repair and physical therapy, patient came from [facility]  Delaine Lame MD [facility]   If 7PM-7AM, please contact night-coverage [email or web address] Password [facility]  03/09/2017, 12:51 PM

## 2017-03-10 ENCOUNTER — Inpatient Hospital Stay (HOSPITAL_COMMUNITY): Payer: Medicare HMO | Admitting: Certified Registered"

## 2017-03-10 ENCOUNTER — Inpatient Hospital Stay (HOSPITAL_COMMUNITY): Payer: Medicare HMO

## 2017-03-10 ENCOUNTER — Encounter (HOSPITAL_COMMUNITY): Payer: Self-pay | Admitting: *Deleted

## 2017-03-10 ENCOUNTER — Encounter (HOSPITAL_COMMUNITY)
Admission: EM | Disposition: A | Discharge status: Skilled Nursing Facility | Payer: Self-pay | Source: Home / Self Care | Attending: Internal Medicine

## 2017-03-10 DIAGNOSIS — I2510 Atherosclerotic heart disease of native coronary artery without angina pectoris: Secondary | ICD-10-CM

## 2017-03-10 DIAGNOSIS — F0390 Unspecified dementia without behavioral disturbance: Secondary | ICD-10-CM

## 2017-03-10 HISTORY — PX: HIP ARTHROPLASTY: SHX981

## 2017-03-10 LAB — BASIC METABOLIC PANEL
BUN: 24 mg/dL — ABNORMAL HIGH (ref 6–20)
Calcium: 9.1 mg/dL (ref 8.9–10.3)
Chloride: 105 mmol/L (ref 101–111)
Creatinine, Ser: 0.98 mg/dL (ref 0.61–1.24)
GFR calc Af Amer: >60 mL/min (ref >60)
GFR calc non Af Amer: >60 mL/min (ref >60)
Glucose, Bld: 136 mg/dL — ABNORMAL HIGH (ref 65–99)
Potassium: 3.1 mmol/L — ABNORMAL LOW (ref 3.5–5.1)
Sodium: 143 mmol/L (ref 135–145)

## 2017-03-10 LAB — CBC
HCT: 34.5 % — ABNORMAL LOW (ref 39.0–52.0)
Hemoglobin: 11.5 g/dL — ABNORMAL LOW (ref 13.0–17.0)
MCH: 31.3 pg (ref 26.0–34.0)
MCHC: 33.3 g/dL (ref 30.0–36.0)
MCV: 94 fL (ref 78.0–100.0)
Platelets: 174 K/uL (ref 150–400)
RBC: 3.67 MIL/uL — ABNORMAL LOW (ref 4.22–5.81)
RDW: 13.3 % (ref 11.5–15.5)
WBC: 15.6 K/uL — ABNORMAL HIGH (ref 4.0–10.5)

## 2017-03-10 LAB — URINALYSIS, ROUTINE W REFLEX MICROSCOPIC
Bilirubin Urine: NEGATIVE
GLUCOSE, UA: NEGATIVE mg/dL
Hgb urine dipstick: NEGATIVE
KETONES UR: 5 mg/dL — AB
Leukocytes, UA: NEGATIVE
Nitrite: NEGATIVE
PROTEIN: 30 mg/dL — AB
Specific Gravity, Urine: 1.027 (ref 1.005–1.030)
Squamous Epithelial / LPF: NONE SEEN
pH: 5 (ref 5.0–8.0)

## 2017-03-10 LAB — PROTIME-INR
INR: 1.14
Prothrombin Time: 14.5 seconds (ref 11.4–15.2)

## 2017-03-10 LAB — BASIC METABOLIC PANEL WITH GFR
Anion gap: 10 (ref 5–15)
CO2: 28 mmol/L (ref 22–32)

## 2017-03-10 LAB — APTT: aPTT: 30 s (ref 24–36)

## 2017-03-10 SURGERY — HEMIARTHROPLASTY, HIP, DIRECT ANTERIOR APPROACH, FOR FRACTURE
Anesthesia: General | Site: Hip | Laterality: Right

## 2017-03-10 MED ORDER — PHENYLEPHRINE 40 MCG/ML (10ML) SYRINGE FOR IV PUSH (FOR BLOOD PRESSURE SUPPORT)
PREFILLED_SYRINGE | INTRAVENOUS | Status: DC | PRN
  Administered 2017-03-10: 80 ug via INTRAVENOUS
  Administered 2017-03-10: 40 ug via INTRAVENOUS
  Administered 2017-03-10: 80 ug via INTRAVENOUS

## 2017-03-10 MED ORDER — SODIUM CHLORIDE 0.9% FLUSH
INTRAVENOUS | Status: DC | PRN
  Administered 2017-03-10: 20 mL

## 2017-03-10 MED ORDER — HYDRALAZINE HCL 20 MG/ML IJ SOLN
5.0000 mg | INTRAMUSCULAR | Status: DC | PRN
Start: 2017-03-10 — End: 2017-03-14
  Administered 2017-03-13: 5 mg via INTRAVENOUS
  Filled 2017-03-10: qty 1

## 2017-03-10 MED ORDER — HYDROCODONE-ACETAMINOPHEN 5-325 MG PO TABS
1.0000 | ORAL_TABLET | Freq: Four times a day (QID) | ORAL | 0 refills | Status: AC | PRN
Start: 2017-03-10 — End: ?

## 2017-03-10 MED ORDER — ONDANSETRON HCL 4 MG/2ML IJ SOLN
INTRAMUSCULAR | Status: DC | PRN
  Administered 2017-03-10: 4 mg via INTRAVENOUS

## 2017-03-10 MED ORDER — LIDOCAINE 2% (20 MG/ML) 5 ML SYRINGE
INTRAMUSCULAR | Status: DC | PRN
  Administered 2017-03-10: 80 mg via INTRAVENOUS

## 2017-03-10 MED ORDER — SODIUM CHLORIDE 0.9 % IV SOLN
2000.0000 mg | INTRAVENOUS | Status: DC
  Filled 2017-03-10: qty 20

## 2017-03-10 MED ORDER — PHENYLEPHRINE 40 MCG/ML (10ML) SYRINGE FOR IV PUSH (FOR BLOOD PRESSURE SUPPORT)
PREFILLED_SYRINGE | INTRAVENOUS | Status: AC
  Filled 2017-03-10: qty 10

## 2017-03-10 MED ORDER — ROCURONIUM BROMIDE 10 MG/ML (PF) SYRINGE
PREFILLED_SYRINGE | INTRAVENOUS | Status: DC | PRN
  Administered 2017-03-10: 40 mg via INTRAVENOUS
  Administered 2017-03-10: 20 mg via INTRAVENOUS

## 2017-03-10 MED ORDER — PROPOFOL 10 MG/ML IV BOLUS
INTRAVENOUS | Status: AC
  Filled 2017-03-10: qty 20

## 2017-03-10 MED ORDER — ACETAMINOPHEN 10 MG/ML IV SOLN: 1000.0000 mg | Freq: Once | INTRAVENOUS | Status: DC | PRN

## 2017-03-10 MED ORDER — ETOMIDATE 2 MG/ML IV SOLN
INTRAVENOUS | Status: DC | PRN
  Administered 2017-03-10: 10 mg via INTRAVENOUS

## 2017-03-10 MED ORDER — HYDROCODONE-ACETAMINOPHEN 5-325 MG PO TABS
1.0000 | ORAL_TABLET | Freq: Four times a day (QID) | ORAL | Status: DC | PRN
  Administered 2017-03-10 – 2017-03-11 (×3): 2 via ORAL
  Filled 2017-03-10 (×3): qty 2

## 2017-03-10 MED ORDER — LACTATED RINGERS IV SOLN
INTRAVENOUS | Status: DC
  Administered 2017-03-10 (×2): via INTRAVENOUS

## 2017-03-10 MED ORDER — POTASSIUM CHLORIDE IN NACL 40-0.9 MEQ/L-% IV SOLN
INTRAVENOUS | Status: DC
  Administered 2017-03-10: 75 mL/h via INTRAVENOUS
  Filled 2017-03-10 (×4): qty 1000

## 2017-03-10 MED ORDER — 0.9 % SODIUM CHLORIDE (POUR BTL) OPTIME
TOPICAL | Status: DC | PRN
  Administered 2017-03-10: 1000 mL

## 2017-03-10 MED ORDER — KETOROLAC TROMETHAMINE 30 MG/ML IJ SOLN
INTRAMUSCULAR | Status: DC | PRN
  Administered 2017-03-10: 30 mg via INTRAMUSCULAR

## 2017-03-10 MED ORDER — SUCCINYLCHOLINE CHLORIDE 200 MG/10ML IV SOSY
PREFILLED_SYRINGE | INTRAVENOUS | Status: AC
  Filled 2017-03-10: qty 10

## 2017-03-10 MED ORDER — KETOROLAC TROMETHAMINE 30 MG/ML IJ SOLN
INTRAMUSCULAR | Status: AC
  Filled 2017-03-10: qty 1

## 2017-03-10 MED ORDER — SUGAMMADEX SODIUM 200 MG/2ML IV SOLN
INTRAVENOUS | Status: AC
  Filled 2017-03-10: qty 2

## 2017-03-10 MED ORDER — FENTANYL CITRATE (PF) 250 MCG/5ML IJ SOLN
INTRAMUSCULAR | Status: AC
  Filled 2017-03-10: qty 5

## 2017-03-10 MED ORDER — SUGAMMADEX SODIUM 200 MG/2ML IV SOLN
INTRAVENOUS | Status: DC | PRN
  Administered 2017-03-10: 170.6 mg via INTRAVENOUS

## 2017-03-10 MED ORDER — ETOMIDATE 2 MG/ML IV SOLN
INTRAVENOUS | Status: AC
  Filled 2017-03-10: qty 10

## 2017-03-10 MED ORDER — LIDOCAINE 2% (20 MG/ML) 5 ML SYRINGE
INTRAMUSCULAR | Status: AC
  Filled 2017-03-10: qty 5

## 2017-03-10 MED ORDER — ONDANSETRON HCL 4 MG/2ML IJ SOLN: 4.0000 mg | Freq: Once | INTRAMUSCULAR | Status: DC | PRN

## 2017-03-10 MED ORDER — BUPIVACAINE HCL 0.25 % IJ SOLN
INTRAMUSCULAR | Status: DC | PRN
  Administered 2017-03-10: 30 mL

## 2017-03-10 MED ORDER — CEFAZOLIN SODIUM-DEXTROSE 2-4 GM/100ML-% IV SOLN
2.0000 g | Freq: Four times a day (QID) | INTRAVENOUS | Status: AC
  Administered 2017-03-10 – 2017-03-11 (×2): 2 g via INTRAVENOUS
  Filled 2017-03-10 (×2): qty 100

## 2017-03-10 MED ORDER — TRANEXAMIC ACID 1000 MG/10ML IV SOLN
INTRAVENOUS | Status: AC | PRN
  Administered 2017-03-10: 2000 mg via TOPICAL

## 2017-03-10 MED ORDER — EPHEDRINE 5 MG/ML INJ
INTRAVENOUS | Status: AC
  Filled 2017-03-10: qty 10

## 2017-03-10 MED ORDER — EPHEDRINE SULFATE-NACL 50-0.9 MG/10ML-% IV SOSY
PREFILLED_SYRINGE | INTRAVENOUS | Status: DC | PRN
  Administered 2017-03-10 (×2): 10 mg via INTRAVENOUS
  Administered 2017-03-10: 5 mg via INTRAVENOUS

## 2017-03-10 MED ORDER — FENTANYL CITRATE (PF) 100 MCG/2ML IJ SOLN: 25.0000 ug | INTRAMUSCULAR | Status: DC | PRN

## 2017-03-10 MED ORDER — ONDANSETRON HCL 4 MG/2ML IJ SOLN
INTRAMUSCULAR | Status: AC
  Filled 2017-03-10: qty 6

## 2017-03-10 MED ORDER — ENOXAPARIN SODIUM 40 MG/0.4ML ~~LOC~~ SOLN: 40.0000 mg | SUBCUTANEOUS | 0 refills | Status: AC

## 2017-03-10 MED ORDER — FENTANYL CITRATE (PF) 250 MCG/5ML IJ SOLN
INTRAMUSCULAR | Status: DC | PRN
  Administered 2017-03-10 (×2): 25 ug via INTRAVENOUS

## 2017-03-10 MED ORDER — TRANEXAMIC ACID 1000 MG/10ML IV SOLN
1000.0000 mg | INTRAVENOUS | Status: AC
  Administered 2017-03-10: 1000 mg via INTRAVENOUS
  Filled 2017-03-10: qty 1.1

## 2017-03-10 MED ORDER — BUPIVACAINE HCL (PF) 0.25 % IJ SOLN
INTRAMUSCULAR | Status: AC
  Filled 2017-03-10: qty 30

## 2017-03-10 SURGICAL SUPPLY — 50 items
BIT DRILL 7/64X5 DISP (BIT) ×3 IMPLANT
BLADE SAGITTAL 25.0X1.27X90 (BLADE) ×2 IMPLANT
BLADE SAGITTAL 25.0X1.27X90MM (BLADE) ×1
CAPT HIP HEMI 2B ×3 IMPLANT
CLOSURE STERI-STRIP 1/2X4 (GAUZE/BANDAGES/DRESSINGS) ×1
CLSR STERI-STRIP ANTIMIC 1/2X4 (GAUZE/BANDAGES/DRESSINGS) ×2 IMPLANT
CONT SPEC 4OZ CLIKSEAL STRL BL (MISCELLANEOUS) ×3 IMPLANT
COVER SURGICAL LIGHT HANDLE (MISCELLANEOUS) ×3 IMPLANT
DRAPE ORTHO SPLIT 77X108 STRL (DRAPES) ×4
DRAPE SURG ORHT 6 SPLT 77X108 (DRAPES) ×2 IMPLANT
DRAPE U-SHAPE 47X51 STRL (DRAPES) ×6 IMPLANT
DRSG MEPILEX BORDER 4X12 (GAUZE/BANDAGES/DRESSINGS) ×3 IMPLANT
DURAPREP 26ML APPLICATOR (WOUND CARE) ×3 IMPLANT
ELECT BLADE 4.0 EZ CLEAN MEGAD (MISCELLANEOUS) ×6
ELECT CAUTERY BLADE 6.4 (BLADE) ×3 IMPLANT
ELECT REM PT RETURN 9FT ADLT (ELECTROSURGICAL) ×3
ELECTRODE BLDE 4.0 EZ CLN MEGD (MISCELLANEOUS) ×2 IMPLANT
ELECTRODE REM PT RTRN 9FT ADLT (ELECTROSURGICAL) ×1 IMPLANT
FACESHIELD WRAPAROUND (MASK) IMPLANT
GLOVE BIO SURGEON STRL SZ7.5 (GLOVE) ×6 IMPLANT
GLOVE BIOGEL PI IND STRL 8 (GLOVE) ×2 IMPLANT
GLOVE BIOGEL PI INDICATOR 8 (GLOVE) ×4
GOWN STRL REUS W/ TWL LRG LVL3 (GOWN DISPOSABLE) ×2 IMPLANT
GOWN STRL REUS W/ TWL XL LVL3 (GOWN DISPOSABLE) ×1 IMPLANT
GOWN STRL REUS W/TWL LRG LVL3 (GOWN DISPOSABLE) ×4
GOWN STRL REUS W/TWL XL LVL3 (GOWN DISPOSABLE) ×2
HIP CAPITATED HEMI 2B ×1 IMPLANT
KIT BASIN OR (CUSTOM PROCEDURE TRAY) ×3 IMPLANT
KIT ROOM TURNOVER OR (KITS) ×3 IMPLANT
MANIFOLD NEPTUNE II (INSTRUMENTS) ×3 IMPLANT
NEEDLE 18GX1X1/2 (RX/OR ONLY) (NEEDLE) ×3 IMPLANT
NEEDLE 22X1 1/2 (OR ONLY) (NEEDLE) ×3 IMPLANT
NS IRRIG 1000ML POUR BTL (IV SOLUTION) ×3 IMPLANT
PACK TOTAL JOINT (CUSTOM PROCEDURE TRAY) ×3 IMPLANT
PAD ARMBOARD 7.5X6 YLW CONV (MISCELLANEOUS) ×9 IMPLANT
PILLOW ABDUCTION HIP (SOFTGOODS) ×3 IMPLANT
RETRIEVER SUT HEWSON (MISCELLANEOUS) ×3 IMPLANT
SUT FIBERWIRE #2 38 REV NDL BL (SUTURE) ×9
SUT MNCRL AB 4-0 PS2 18 (SUTURE) ×3 IMPLANT
SUT MON AB 2-0 CT1 36 (SUTURE) ×3 IMPLANT
SUT VIC AB 1 CT1 27 (SUTURE) ×6
SUT VIC AB 1 CT1 27XBRD ANBCTR (SUTURE) ×3 IMPLANT
SUT VLOC 180 0 24IN GS25 (SUTURE) ×3 IMPLANT
SUTURE FIBERWR#2 38 REV NDL BL (SUTURE) ×3 IMPLANT
SYR 3ML LL SCALE MARK (SYRINGE) ×3 IMPLANT
SYR 50ML LL SCALE MARK (SYRINGE) ×3 IMPLANT
TOWEL OR 17X24 6PK STRL BLUE (TOWEL DISPOSABLE) ×3 IMPLANT
TOWEL OR 17X26 10 PK STRL BLUE (TOWEL DISPOSABLE) ×3 IMPLANT
TRAY FOLEY CATH SILVER 14FR (SET/KITS/TRAYS/PACK) IMPLANT
WATER STERILE IRR 1000ML POUR (IV SOLUTION) ×3 IMPLANT

## 2017-03-10 NOTE — Discharge Instructions (Signed)
Weight bearing as Tolerated. Posterior Hip Precautions. Keep dressing clean and dry.  Leave in place until follow up unless soiled.

## 2017-03-10 NOTE — Transfer of Care (Signed)
Immediate Anesthesia Transfer of Care Note  Patient: Joe Mcpherson  Procedure(s) Performed: ARTHROPLASTY BIPOLAR HIP (HEMIARTHROPLASTY) (Right Hip)  Patient Location: PACU  Anesthesia Type:General  Level of Consciousness: drowsy and patient cooperative  Airway & Oxygen Therapy: Patient Spontanous Breathing and Patient connected to face mask oxygen  Post-op Assessment: Report given to RN and Post -op Vital signs reviewed and stable  Post vital signs: Reviewed and stable  Last Vitals:  Vitals:   03/10/17 0100 03/10/17 0643  BP:  (!) 156/63  Pulse: 68 63  Resp: 16 18  Temp:  36.8 C  SpO2: 98% 99%    Last Pain:  Vitals:   03/10/17 0643  TempSrc: Oral  PainSc: Asleep      Patients Stated Pain Goal: 1 (03/09/17 1957)  Complications: No apparent anesthesia complications

## 2017-03-10 NOTE — Progress Notes (Signed)
Patient seen in the periop area after being transferred to [facility].  Patient has a history of CAD status post stenting, dementia, hypertension, orthostatic hypotension admitted for right hip fracture status post fall. Patient denied any complaints besides right hip pain.  No other complaints. Vital signs are stable  Continue care as planned by Dr. Izola PriceMyers.  Please call with any further questions as necessary.

## 2017-03-10 NOTE — Anesthesia Procedure Notes (Signed)
Procedure Name: Intubation Date/Time: 03/10/2017 1:18 PM Performed by: Freddie Breech, CRNA Pre-anesthesia Checklist: Patient identified, Emergency Drugs available, Suction available and Patient being monitored Patient Re-evaluated:Patient Re-evaluated prior to induction Oxygen Delivery Method: Circle System Utilized Preoxygenation: Pre-oxygenation with 100% oxygen Induction Type: IV induction Ventilation: Mask ventilation without difficulty Laryngoscope Size: Mac and 4 Grade View: Grade I Tube type: Oral Tube size: 7.0 mm Number of attempts: 1 Airway Equipment and Method: Stylet and Oral airway Placement Confirmation: ETT inserted through vocal cords under direct vision,  positive ETCO2 and breath sounds checked- equal and bilateral Secured at: 23 cm Tube secured with: Tape Dental Injury: Teeth and Oropharynx as per pre-operative assessment

## 2017-03-10 NOTE — Progress Notes (Addendum)
Patient ID: Joe Mcpherson, male   DOB: [birth year], 84 y.o.   MRN: [id number]    PROGRESS NOTE    Joe Mcpherson  Joe Mcpherson DOB: 24-Nov-1924 DOA: 03/09/2017  PCP: Default, Provider, MD   Brief Narrative:  Pt is 84 yo male with orthostatic hypotension on aldosterone antagonist, CAD s/p stenting, SVT, dementia, HTN ,presented after mechanical fall and has sustained right hip fracture.   Assessment & Plan:   Principal Problem:   Hip fracture, right hip - in the setting of mechanical fall - continue analgesia as needed - plan to take to [facility] for surgery  - keep NPO   Active Problems:   Leukocytosis - unclear etiology, ? Early PNA on CXR but pt with no specific pulmonary concerns, no cough and no dyspnea  - no fevers overnight, will need to monitor closely  - if WBC up or pt with fevers, would start empiric ABX    Hypokalemia - supplement and repeat BMP in AM    CAD (coronary artery disease, S/P angioplasty with stent, Hx of SVT - no anginal concerns this AM  - has been on Diltiazem in the past but not any longer  - continue Aspirin    HTN (hypertension) - slightly up this am and suspect related to pain from hip fracture - place on hydralazine IV as needed for now until pt NPO    Dementia - unclear what the baseline is - will need PT/OT eval post op    Orthostasis, dysautonomia  - on fludrocortisone 0.2 mg PO QD - started on pre op IV hydrocortisone 50 mg Q8 and change to home regimen post op    DVT prophylaxis: Lovenox Sq Code Status: Full  Family Communication: Patient at bedside  Disposition Plan: transfer to [facility]   Consultants:   Ortho   Procedures:   None  Antimicrobials:   Pre op abx   Subjective: No events overnight.   Objective: Vitals:   03/09/17 1957 03/09/17 2141 03/10/17 0100 03/10/17 0643  BP: (!) 147/69 (!) 144/76  (!) 156/63  Pulse: 94 95 68 63  Resp: 20 18 16 18   Temp: 97.8 F (36.6 C) 98.4 F (36.9 C)  98.2 F (36.8 C)  TempSrc:  Oral Oral  Oral  SpO2: 98% 99% 98% 99%    Intake/Output Summary (Last 24 hours) at 03/10/2017 0904 Last data filed at 03/10/2017 9562 Gross per 24 hour  Intake 1200 ml  Output 1200 ml  Net 0 ml   There were no vitals filed for this visit.  Examination:  General exam: Appears calm and NAD Respiratory system: Clear to auscultation. Respiratory effort normal. Cardiovascular system: RRR. No JVD, murmurs, rubs, gallops or clicks.  Gastrointestinal system: Abdomen is nondistended, soft and nontender. No organomegaly or masses felt. Normal bowel sounds heard. Central nervous system: Alert, moving upper and lower extremities but limited motion in the RLE due to pain  Skin: No rashes, lesions or ulcers Psychiatry: somewhat confused but pleasant and able to follow commands    Data Reviewed: I have personally reviewed following labs and imaging studies  CBC: Recent Labs  Lab 03/09/17 1038 03/10/17 0535  WBC 6.4 15.6*  NEUTROABS 5.0  --   HGB 11.2* 11.5*  HCT 34.2* 34.5*  MCV 94.7 94.0  PLT 184 174   Basic Metabolic Panel: Recent Labs  Lab 03/09/17 1038 03/10/17 0535  NA 142 143  K 3.4* 3.1*  CL 107 105  CO2 25 28  GLUCOSE 88 136*  BUN  19 24*  CREATININE 1.00 0.98  CALCIUM 9.1 9.1   Coagulation Profile: Recent Labs  Lab 03/09/17 1214 03/10/17 0535  INR 0.99 1.14    Recent Results (from the past 240 hour(s))  Surgical pcr screen     Status: None   Collection Time: 03/09/17  8:10 PM  Result Value Ref Range Status   MRSA, PCR NEGATIVE NEGATIVE Final   Staphylococcus aureus NEGATIVE NEGATIVE Final    Comment: (NOTE) The Xpert SA Assay (FDA approved for NASAL specimens in patients 82 years of age and older), is one component of a comprehensive surveillance program. It is not intended to diagnose infection nor to guide or monitor treatment. Performed at [facility], [address] [place], [place] [address]     Radiology Studies: Dg Chest 1  View  Result Date: 03/09/2017 CLINICAL DATA:  The patient slipped and fell today and has diffuse right hip pain. EXAM: CHEST 1 VIEW COMPARISON:  Portable chest x-ray of December 22, 2011 FINDINGS: The lungs are hyperinflated. There is no focal infiltrate. The lung markings are increased however in the right upper lobe. There is scleroses of the posterior aspect of the right fourth rib which has progressed since the previous study. The heart is top-normal in size. The pulmonary vascularity is normal. There tortuosity of the descending thoracic aorta. No acute rib fracture is observed. IMPRESSION: COPD. Slightly increased right upper lobe lung markings as compared to the previous study may reflect atelectasis or early pneumonia. No CHF. Increased density within the posterior aspect of the right fourth rib. Thoracic aortic atherosclerosis. Electronically Signed   By: Joe  SwazilandJordan M.D.   On: 03/09/2017 10:25   Dg Hip Unilat  With Pelvis 2-3 Views Right  Result Date: 03/09/2017 CLINICAL DATA:  Status post fall today with diffuse right hip pain. EXAM: DG HIP (WITH OR WITHOUT PELVIS) 2-3V RIGHT COMPARISON:  None in PACs FINDINGS: Patient has sustained an acute subcapital fracture of the right hip. The right femoral head remains appropriately position with respect to the acetabulum. There is mild right hip joint space narrowing. The intertrochanteric and immediate subtrochanteric regions of the right hip are intact. 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 IMPRESSION: There is an acute subcapital fracture of the right hip. Electronically Signed   By: Joe  [place]Jordan M.D.   On: 03/09/2017 10:26   Scheduled Meds: . acetaminophen  1,000 mg Oral On Call to OR  . aspirin EC  81 mg Oral Daily  . chlorhexidine  60 mL Topical Once  . enoxaparin (LOVENOX) injection  40 mg Subcutaneous Q24H  . fludrocortisone  0.2 mg Oral Daily  . hydrocortisone sod succinate (SOLU-CORTEF) inj  50 mg Intravenous Q8H  .  Melatonin  3 mg Oral QHS  . memantine  5 mg Oral QHS  . methocarbamol  500 mg Intramuscular Once  . mirtazapine  15 mg Oral QHS  . povidone-iodine  2 application Topical Once  . sodium chloride flush  3 mL Intravenous Q12H   Continuous Infusions: . sodium chloride 75 mL/hr at 03/10/17 0452  .  ceFAZolin (ANCEF) IV       LOS: 1 day   Time spent: 25 minutes   Debbora PrestoIskra Magick-Vanessia Bokhari, MD [facility] Pager [phone]  If 7PM-7AM, please contact night-coverage [email or web address] Password [email or web address] 03/10/2017, 9:04 AM

## 2017-03-10 NOTE — Anesthesia Preprocedure Evaluation (Addendum)
Anesthesia Evaluation  Patient identified by MRN, date of birth, ID band Patient confused    Reviewed: Allergy & Precautions, NPO status , Patient's Chart, lab work & pertinent test results  Airway Mallampati: II  TM Distance: >3 FB Neck ROM: Full    Dental no notable dental hx.    Pulmonary neg pulmonary ROS,    Pulmonary exam normal breath sounds clear to auscultation       Cardiovascular hypertension, + CAD  negative cardio ROS Normal cardiovascular exam Rhythm:Regular Rate:Normal     Neuro/Psych negative neurological ROS  negative psych ROS   GI/Hepatic negative GI ROS, Neg liver ROS,   Endo/Other  negative endocrine ROS  Renal/GU negative Renal ROS  negative genitourinary   Musculoskeletal negative musculoskeletal ROS (+)   Abdominal   Peds  Hematology negative hematology ROS (+)   Anesthesia Other Findings   Reproductive/Obstetrics                            Lab Results  Component Value Date   WBC 15.6 (H) 03/10/2017   HGB 11.5 (L) 03/10/2017   HCT 34.5 (L) 03/10/2017   MCV 94.0 03/10/2017   PLT 174 03/10/2017    Anesthesia Physical Anesthesia Plan  ASA: IV  Anesthesia Plan: General   Post-op Pain Management:    Induction:   PONV Risk Score and Plan: 2 and Treatment may vary due to age or medical condition and Ondansetron  Airway Management Planned: Oral ETT  Additional Equipment:   Intra-op Plan:   Post-operative Plan: Extubation in OR  Informed Consent: I have reviewed the patients History and Physical, chart, labs and discussed the procedure including the risks, benefits and alternatives for the proposed anesthesia with the patient or authorized representative who has indicated his/her understanding and acceptance.   Dental advisory given  Plan Discussed with: CRNA and Anesthesiologist  Anesthesia Plan Comments:         Anesthesia Quick  Evaluation

## 2017-03-10 NOTE — Interval H&P Note (Signed)
History and Physical Interval Note:  03/10/2017 12:07 PM  Joe Mcpherson  has presented today for surgery, with the diagnosis of closed displace fx of right femoral neck  The various methods of treatment have been discussed with the patient and family. After consideration of risks, benefits and other options for treatment, the patient has consented to  Procedure(s): ARTHROPLASTY BIPOLAR HIP (HEMIARTHROPLASTY) (Right) as a surgical intervention .  The patient's history has been reviewed, patient examined, no change in status, stable for surgery.  I have reviewed the patient's chart and labs.  Questions were answered to the patient's satisfaction.     Marcille Barman D

## 2017-03-10 NOTE — Op Note (Signed)
03/09/2017 - 03/10/2017  2:13 PM  PATIENT:  Joe Mcpherson   MRN: [id number]  PRE-OPERATIVE DIAGNOSIS:  closed displace fx of right femoral neck  POST-OPERATIVE DIAGNOSIS:  * No post-op diagnosis entered *  PROCEDURE:  Procedure(s): ARTHROPLASTY BIPOLAR HIP (HEMIARTHROPLASTY)  PREOPERATIVE INDICATIONS:  Joe Mcpherson is an 84 y.o. male who was admitted 03/09/2017 with a diagnosis of Hip fracture requiring operative repair [facility]) and elected for surgical management.  The risks benefits and alternatives were discussed with the patient including but not limited to the risks of nonoperative treatment, versus surgical intervention including infection, bleeding, nerve injury, periprosthetic fracture, the need for revision surgery, dislocation, leg length discrepancy, blood clots, cardiopulmonary complications, morbidity, mortality, among others, and they were willing to proceed.  Predicted outcome is good, although there will be at least a six to nine month expected recovery.   OPERATIVE REPORT     SURGEON:  Edmonia Lynch, MD    ASSISTANT:  Roxan Hockey, PA-C, he was present and scrubbed throughout the case, critical for completion in a timely fashion, and for retraction, instrumentation, and closure.     ANESTHESIA:  General    COMPLICATIONS:  None.      COMPONENTS:  [employer] A[employer]: Femoral stem: 8, Femoral Head:56, Neck:0   PROCEDURE IN DETAIL: The patient was met in the holding area and identified.  The appropriate hip  was marked at the operative site. The patient was then transported to the OR and  placed under general anesthesia.  At that point, the patient was  placed in the lateral decubitus position with the operative side up and  secured to the operating room table and all bony prominences padded.     The operative lower extremity was prepped from the iliac crest to the toes.  Sterile draping was performed.  Time out was performed prior to incision.      A routine posterolateral  approach was utilized via sharp dissection  carried down to the subcutaneous tissue.  Gross bleeders were Bovie  coagulated.  The iliotibial band was identified and incised  along the length of the skin incision.  Self-retaining retractors were  inserted. I examined the bursa there was significant hematoma and edema I performed a bursectomy here.  With the hip internally rotated, the short external rotators  were identified. The piriformis was tagged with FiberWire, and the hip capsule released in a T-type fashion.  The femoral neck was exposed, and I resected the femoral neck using the appropriate jig. This was performed at approximately a thumb's breadth above the lesser trochanter.    I then exposed the deep acetabulum, cleared out any tissue including the ligamentum teres, and included the hip capsule in the FiberWire used above and below the T.    I then prepared the proximal femur using the cookie-cutter, the lateralizing reamer, and then sequentially broached.  A trial utilized, and I reduced the hip and it was found to have excellent stability with functional range of motion. The trial components were then removed.   The canal and acetabulum were thoroughly irrigated  I inserted the pressfit stem and placed the head and neck collar. The hip was reduced with appropriate force and was stable through a range of motion.   I then used a 2 mm drill bits to pass the FiberWire suture from the capsule and puriform is through the greater trochanter, and secured this. Excellent posterior capsular repair was achieved. I also closed the T in the capsule.  I then  irrigated the hip copiously again with pulse lavage, and repaired the fascia with Vicryl, followed by Vicryl for the subcutaneous tissue, Monocryl for the skin, Steri-Strips and sterile gauze. The wounds were injected. The patient was then awakened and returned to PACU in stable and satisfactory condition. There were no complications.  POST-OP  PLAN: Weight bearing as tolerated. DVT px will consist of SCD's and mobilization and chemical px  Edmonia Lynch, MD Orthopedic Surgeon [phone]   03/10/2017 2:13 PM

## 2017-03-10 NOTE — Progress Notes (Signed)
Nurse called [facility] for transport to [facility]. Patient to have surgery with Dr. Lajoyce Cornersuda. [facility] by 11am. Per [facility], [facility] at approximately 10am.

## 2017-03-11 DIAGNOSIS — Z959 Presence of cardiac and vascular implant and graft, unspecified: Secondary | ICD-10-CM

## 2017-03-11 DIAGNOSIS — I10 Essential (primary) hypertension: Secondary | ICD-10-CM

## 2017-03-11 DIAGNOSIS — W19XXXA Unspecified fall, initial encounter: Secondary | ICD-10-CM

## 2017-03-11 DIAGNOSIS — I951 Orthostatic hypotension: Secondary | ICD-10-CM

## 2017-03-11 LAB — CBC
HCT: 25.1 % — ABNORMAL LOW (ref 39.0–52.0)
Hemoglobin: 8.1 g/dL — ABNORMAL LOW (ref 13.0–17.0)
MCH: 30.5 pg (ref 26.0–34.0)
MCHC: 32.3 g/dL (ref 30.0–36.0)
MCV: 94.4 fL (ref 78.0–100.0)
PLATELETS: 145 10*3/uL — AB (ref 150–400)
RBC: 2.66 MIL/uL — AB (ref 4.22–5.81)
RDW: 13.3 % (ref 11.5–15.5)
WBC: 11 10*3/uL — ABNORMAL HIGH (ref 4.0–10.5)

## 2017-03-11 LAB — BASIC METABOLIC PANEL
ANION GAP: 12 (ref 5–15)
BUN: 30 mg/dL — AB (ref 6–20)
CO2: 26 mmol/L (ref 22–32)
Calcium: 8.6 mg/dL — ABNORMAL LOW (ref 8.9–10.3)
Chloride: 104 mmol/L (ref 101–111)
Creatinine, Ser: 1.3 mg/dL — ABNORMAL HIGH (ref 0.61–1.24)
GFR calc Af Amer: 54 mL/min — ABNORMAL LOW (ref >60)
GFR, EST NON AFRICAN AMERICAN: 47 mL/min — AB (ref >60)
Glucose, Bld: 163 mg/dL — ABNORMAL HIGH (ref 65–99)
POTASSIUM: 3.6 mmol/L (ref 3.5–5.1)
SODIUM: 142 mmol/L (ref 135–145)

## 2017-03-11 MED ORDER — HYDROCORTISONE NA SUCCINATE PF 100 MG IJ SOLR
50.0000 mg | Freq: Two times a day (BID) | INTRAMUSCULAR | Status: DC
  Filled 2017-03-11: qty 1

## 2017-03-11 MED ORDER — LORAZEPAM 2 MG/ML IJ SOLN
0.5000 mg | INTRAMUSCULAR | Status: DC | PRN
  Administered 2017-03-11 – 2017-03-13 (×5): 0.5 mg via INTRAVENOUS
  Filled 2017-03-11 (×5): qty 1

## 2017-03-11 NOTE — Progress Notes (Signed)
Patient relocated from 5N21 to 5N29 closer to nurses station due to increased confusion and trying to jump out of bed for safety reasons

## 2017-03-11 NOTE — Progress Notes (Signed)
Patient ID: Joe SignsRobert Mcpherson, male   DOB: [birth year], 84 y.o.   MRN: [id number]     Subjective:  Patient reports pain as mild.  Patient reports great improvement.   In the bed and in no acute distress.  Objective:   VITALS:   Vitals:   03/10/17 2016 03/11/17 0009 03/11/17 0402 03/11/17 0755  BP: 123/62 126/60 131/68 140/60  Pulse: 100 85 88 76  Resp: 20 17 17 16   Temp: 97.6 F (36.4 C) (!) 97.1 F (36.2 C) 98.4 F (36.9 C) 98 F (36.7 C)  TempSrc: Oral Axillary Oral   SpO2: 96% 95% 93% 96%  Weight:      Height:        ABD soft Sensation intact distally Dorsiflexion/Plantar flexion intact Incision: dressing C/D/I and no drainage   Lab Results  Component Value Date   WBC 11.0 (H) 03/11/2017   HGB 8.1 (L) 03/11/2017   HCT 25.1 (L) 03/11/2017   MCV 94.4 03/11/2017   PLT 145 (L) 03/11/2017   BMET    Component Value Date/Time   NA 142 03/11/2017 0435   K 3.6 03/11/2017 0435   CL 104 03/11/2017 0435   CO2 26 03/11/2017 0435   GLUCOSE 163 (H) 03/11/2017 0435   BUN 30 (H) 03/11/2017 0435   CREATININE 1.30 (H) 03/11/2017 0435   CALCIUM 8.6 (L) 03/11/2017 0435   GFRNONAA 47 (L) 03/11/2017 0435   GFRAA 54 (L) 03/11/2017 0435     Assessment/Plan: 1 Day Post-Op   Principal Problem:   Hip fracture requiring operative repair [facility](HCC) Active Problems:   Hip fracture, unspecified laterality, closed, initial encounter (HCC)   CAD (coronary artery disease)   S/P angioplasty with stent   HTN (hypertension)   Dementia   Orthostasis   Advance diet Up with therapy Continue plan per medicine WBAT Dry dressing PRN    Haskel KhanDOUGLAS Mahala Rommel 03/11/2017, 10:16 AM   Teryl LucyJoshua Landau, MD Cell [phone]

## 2017-03-11 NOTE — Progress Notes (Signed)
Dr Jarvis NewcomerGrunz paged in regards to patient needing anxiety medication!

## 2017-03-11 NOTE — Progress Notes (Signed)
Orthopedic Tech Progress Note Patient Details:  Joe SignsRobert Mcpherson 10-01-1924 [id number]  Patient ID: Joe Mcpherson, male   DOB: 10-01-1924, 84 y.o.   MRN: [id number] Pt cant have ohf due to age restrictions  Trinna PostMartinez, Massey Ruhland J 03/11/2017, 3:11 AM

## 2017-03-11 NOTE — Evaluation (Signed)
Physical Therapy Evaluation Patient Details Name: Joe Mcpherson MRN: [id number] DOB: 1924-12-16 Today's Date: 03/11/2017   History of Present Illness  Pt is a 84 y.o. male who presented to the ED after a mechanical fall and found to have a R femoral neck fracture. Now s/p R bipolar hip hemiarthroplasty. PMH significant for orthostatic hypotension, SVT, dementia, and hypertension.     Clinical Impression  Patient is s/p above surgery resulting in functional limitations due to the deficits listed below (see PT Problem List). No family present to obtain history, patient limited this visit by confusion and drowsiness, AOx0. Upon eval patient presents with mild post op pain and weakness and cognitive deficits that limit his ability to mobilize safely. Upon entry patient laying in bed violating hip flexion precautions. Min A x2 for safety due to sporadic and sudden movements and restlessness and unsafe to ambulate in hospital room at this time. Patient able to tolerate standing for short periods of time. Replaced foam block to protect hip precautions upon exit. Of note patient was not wearing an hour later with constant bed alarms going of due to confusion.  Patient will benefit from skilled PT to increase their independence and safety with mobility to allow discharge to the venue listed below.       Follow Up Recommendations SNF;Supervision/Assistance - 24 hour    Equipment Recommendations  None recommended by PT    Recommendations for Other Services       Precautions / Restrictions Precautions Precautions: Posterior Hip;Fall Precaution Booklet Issued: No Precaution Comments: Limited understanding of precautions due to cognitive deficits Restrictions Weight Bearing Restrictions: Yes RLE Weight Bearing: Weight bearing as tolerated      Mobility  Bed Mobility Overal bed mobility: Needs Assistance Bed Mobility: Supine to Sit;Sit to Supine     Supine to sit: Min assist;+2 for physical  assistance Sit to supine: Max assist;+2 for physical assistance   General bed mobility comments: Increased assist to return to bed due to cognition and restlessness.   Transfers Overall transfer level: Needs assistance Equipment used: 2 person hand held assist Transfers: Sit to/from Stand Sit to Stand: Min assist;+2 physical assistance;+2 safety/equipment         General transfer comment: Min assist +2 to power up safely. Pt confused and restless making him unsafe at times. Easily distracted during transfer by external visual stimuli.   Ambulation/Gait             General Gait Details: defered for patient safety, 2/2 confusion  Stairs            Wheelchair Mobility    Modified Rankin (Stroke Patients Only)       Balance Overall balance assessment: Needs assistance Sitting-balance support: No upper extremity supported;Feet supported Sitting balance-Leahy Scale: Fair     Standing balance support: Bilateral upper extremity supported;During functional activity;No upper extremity supported;Single extremity supported Standing balance-Leahy Scale: Poor Standing balance comment: Posterior lean and supporting himself with BLE pressed against bed. Reports "I will fall forward" when asked to lean forward and support himself with therapists hand.                              Pertinent Vitals/Pain      Home Living Family/patient expects to be discharged to:: Skilled nursing facility                 Additional Comments: Per chart, pt has been staying at a  SNF    Prior Function Level of Independence: Needs assistance   Gait / Transfers Assistance Needed: Unable to report mobility PLOF due to confusion.  ADL's / Homemaking Assistance Needed: Likely requires staff assistance at SNF but unable to report PLOF.         Hand Dominance        Extremity/Trunk Assessment   Upper Extremity Assessment Upper Extremity Assessment: Generalized  weakness    Lower Extremity Assessment Lower Extremity Assessment: Generalized weakness;Difficult to assess due to impaired cognition RLE Deficits / Details: s/p bipolar hemiarthroplasty; no grimacing noted with movement and pt moving R LE freely       Communication      Cognition Arousal/Alertness: Awake/alert;Lethargic Behavior During Therapy: Restless Overall Cognitive Status: Impaired/Different from baseline Area of Impairment: Orientation;Attention;Memory;Following commands;Safety/judgement;Awareness;Problem solving                 Orientation Level: Disoriented to;Place;Time;Situation Current Attention Level: Focused Memory: Decreased recall of precautions;Decreased short-term memory Following Commands: Follows one step commands inconsistently Safety/Judgement: Decreased awareness of safety;Decreased awareness of deficits Awareness: Intellectual Problem Solving: Slow processing;Decreased initiation;Difficulty sequencing;Requires verbal cues;Requires tactile cues General Comments: Pt fidgeting with all cloth around him and attempting to leave his bed but unable to report where he was attempting to go. Suspect pt overstimulated with sensory input as he did ask therapist to be quiet.       General Comments General comments (skin integrity, edema, etc.): patient in and out of sleep throurghout session. extremly restless and figidity. AO x0    Exercises     Assessment/Plan    PT Assessment Patient needs continued PT services  PT Problem List Decreased strength;Decreased mobility;Decreased balance;Decreased activity tolerance;Decreased range of motion;Decreased coordination;Decreased cognition;Decreased knowledge of use of DME;Decreased safety awareness;Decreased knowledge of precautions;Pain       PT Treatment Interventions DME instruction;Gait training;Stair training;Functional mobility training;Therapeutic activities;Therapeutic exercise;Balance training;Neuromuscular  re-education;Cognitive remediation;Patient/family education    PT Goals (Current goals can be found in the Care Plan section)  Acute Rehab PT Goals Patient Stated Goal: unable to state; did report he wanted to watch his remote PT Goal Formulation: With patient Time For Goal Achievement: 03/18/17 Potential to Achieve Goals: Good    Frequency Min 3X/week   Barriers to discharge Decreased caregiver support      Co-evaluation PT/OT/SLP Co-Evaluation/Treatment: Yes Reason for Co-Treatment: Complexity of the patient's impairments (multi-system involvement);Necessary to address cognition/behavior during functional activity;For patient/therapist safety;To address functional/ADL transfers PT goals addressed during session: Mobility/safety with mobility;Balance;Proper use of DME;Strengthening/ROM         AM-PAC PT "6 Clicks" Daily Activity  Outcome Measure Difficulty turning over in bed (including adjusting bedclothes, sheets and blankets)?: Unable Difficulty moving from lying on back to sitting on the side of the bed? : Unable Difficulty sitting down on and standing up from a chair with arms (e.g., wheelchair, bedside commode, etc,.)?: Unable Help needed moving to and from a bed to chair (including a wheelchair)?: A Lot Help needed walking in hospital room?: A Lot Help needed climbing 3-5 steps with a railing? : Total 6 Click Score: 8    End of Session   Activity Tolerance: Patient limited by lethargy Patient left: in chair;with call bell/phone within reach;with bed alarm set(with abduction block tied on) Nurse Communication: Mobility status;Precautions PT Visit Diagnosis: Unsteadiness on feet (R26.81);Other abnormalities of gait and mobility (R26.89);Pain;Muscle weakness (generalized) (M62.81) Pain - Right/Left: Right Pain - part of body: Hip    Time: 1610-9604 PT Time  Calculation (min) (ACUTE ONLY): 25 min   Charges:   PT Evaluation $PT Eval High Complexity: 1 High     PT  G Codes:       Etta Grandchild, PT, DPT Acute Rehab Services Pager: [phone]    Etta Grandchild 03/11/2017, 6:45 PM

## 2017-03-11 NOTE — Progress Notes (Signed)
SLP Cancellation Note  Patient Details Name: Joe Mcpherson MRN: [id number] DOB: [birth year]   Cancelled treatment:       Reason Eval/Treat Not Completed: Patient declined, no reason specified; pt in extreme pain; refused all PO's; nursing informed of pain level and pain meds were to be administered.   Tressie StalkerPat Didi Ganaway, M.S., CCC-SLP 03/11/2017, 11:11 AM

## 2017-03-11 NOTE — Progress Notes (Addendum)
PROGRESS NOTE  Joe Mcpherson  WUJ:[id number] DOB: 1925/01/12 DOA: 03/09/2017 PCP: Default, Provider, MD  Brief Narrative: Joe Mcpherson is a 84 y.o. male with a history of dementia, CAD s/p PCI, HTN, orthostatic hypotension on florinef who presented to the ED after a mechanical fall found to have a right femoral neck fracture. Orthopedics, Dr. Renaye Rakers, performed right hemiarthroplasty 2/15. Post-operative course has been complicated by acute delirium.   Assessment & Plan: Principal Problem:   Hip fracture requiring operative repair [facility]) Active Problems:   Hip fracture, unspecified laterality, closed, initial encounter (HCC)   CAD (coronary artery disease)   S/P angioplasty with stent   HTN (hypertension)   Dementia   Orthostasis  Right femoral neck fracture: s/p hemiarthroplasty 2/15 by Dr. Eulah Pont.  - WBAT RLE, PT/OT ordered - Pain control as ordered.  Acute delirium on chronic dementia:  - Delirium precautions. Pain is controlled, limiting provocative medications as able. - Sitter at bedside for patient safety. He is at very high risk of falling and harming himself.  - Soft restraints ok for limited periods of time and I've ordered low dose IV ativan prn refractory agitation.  - Continue home namenda and taper steroids as able  Leukocytosis: Suspect reactive to fall, fracture. No fever. RUL markings noted on CXR though does not have clinical evidence of pneumonia. With steroid use, WBC may become less reliable. - Monitor fever curve off abx. Culture if fever develops  Acute blood loss anemia: Due to surgery 2/15. Hgb 11.5 > 8.1. No evidence of ongoing bleeding and vital signs are stable, so also suspect some hemodilutional component.  - Monitor CBC in AM.   Thrombocytopenia: Mild.  - Monitor in AM  AKI: SCr up to 1.30 postop.  - Continue IVF's, monitor UOP and BMP in AM  Orthostatic hypotension, dysautonomia:  - Continue home fludrocortisone, taper stress steroids today (BP  up).  CAD s/p angioplasty with stenting and h/o SVT:  - Continue ASA. If SVT noted, consider diltiazem  HTN:  - prn hydralazine  DVT prophylaxis: Per orthopedics Code Status: Full Family Communication: None at bedside this AM Disposition Plan: Therapy evaluations pending.  Consultants:  Orthopedics, Dr. Renaye Rakers: ARTHROPLASTY BIPOLAR HIP (HEMIARTHROPLASTY) 03/10/2017  Antimicrobials:  Ancef perioperatively   Subjective: Pain is controlled, denies any concerns this morning. Resting quietly. No events overnight. This afternoon he has grown restless, taking off clothes, pulling off dressings.   Objective: Vitals:   03/11/17 0009 03/11/17 0402 03/11/17 0755 03/11/17 1330  BP: 126/60 131/68 140/60 (!) 150/91  Pulse: 85 88 76 (!) 117  Resp: 17 17 16 18   Temp: (!) 97.1 F (36.2 C) 98.4 F (36.9 C) 98 F (36.7 C) 98.2 F (36.8 C)  TempSrc: Axillary Oral  Oral  SpO2: 95% 93% 96% 99%  Weight:      Height:        Intake/Output Summary (Last 24 hours) at 03/11/2017 1414 Last data filed at 03/10/2017 2143 Gross per 24 hour  Intake 1825 ml  Output 250 ml  Net 1575 ml   Filed Weights   03/10/17 1040  Weight: 85.3 kg (188 lb)    Gen: Elderly male in no distress Pulm: Non-labored breathing room air. Clear to auscultation bilaterally.  CV: Regular rate and rhythm. No murmur, rub, or gallop. No JVD, no pedal edema. GI: Abdomen soft, non-tender, non-distended, with normoactive bowel sounds. No organomegaly or masses felt. Ext: Warm, no deformities. RLE with painless ROM, dressings c/d/i, compartment soft. Sensorimotor function  intact distally. Skin: No rashes, lesions ulcers Neuro: Alert and oriented. No focal neurological deficits. Psych: Judgement and insight appear normal. Mood & affect appropriate.   Data Reviewed: I have personally reviewed following labs and imaging studies  CBC: Recent Labs  Lab 03/09/17 1038 03/10/17 0535 03/11/17 0435  WBC 6.4 15.6* 11.0*    NEUTROABS 5.0  --   --   HGB 11.2* 11.5* 8.1*  HCT 34.2* 34.5* 25.1*  MCV 94.7 94.0 94.4  PLT 184 174 145*   Basic Metabolic Panel: Recent Labs  Lab 03/09/17 1038 03/10/17 0535 03/11/17 0435  NA 142 143 142  K 3.4* 3.1* 3.6  CL 107 105 104  CO2 25 28 26   GLUCOSE 88 136* 163*  BUN 19 24* 30*  CREATININE 1.00 0.98 1.30*  CALCIUM 9.1 9.1 8.6*   GFR: Estimated Creatinine Clearance: 43.9 mL/min (A) (by C-G formula based on SCr of 1.3 mg/dL (H)). Liver Function Tests: No results for input(s): AST, ALT, ALKPHOS, BILITOT, PROT, ALBUMIN in the last 168 hours. No results for input(s): LIPASE, AMYLASE in the last 168 hours. No results for input(s): AMMONIA in the last 168 hours. Coagulation Profile: Recent Labs  Lab 03/09/17 1214 03/10/17 0535  INR 0.99 1.14   Cardiac Enzymes: No results for input(s): CKTOTAL, CKMB, CKMBINDEX, TROPONINI in the last 168 hours. BNP (last 3 results) No results for input(s): PROBNP in the last 8760 hours. HbA1C: No results for input(s): HGBA1C in the last 72 hours. CBG: No results for input(s): GLUCAP in the last 168 hours. Lipid Profile: No results for input(s): CHOL, HDL, LDLCALC, TRIG, CHOLHDL, LDLDIRECT in the last 72 hours. Thyroid Function Tests: No results for input(s): TSH, T4TOTAL, FREET4, T3FREE, THYROIDAB in the last 72 hours. Anemia Panel: No results for input(s): VITAMINB12, FOLATE, FERRITIN, TIBC, IRON, RETICCTPCT in the last 72 hours. Urine analysis:    Component Value Date/Time   COLORURINE YELLOW 03/10/2017 0924   APPEARANCEUR CLEAR 03/10/2017 0924   LABSPEC 1.027 03/10/2017 0924   PHURINE 5.0 03/10/2017 0924   GLUCOSEU NEGATIVE 03/10/2017 0924   HGBUR NEGATIVE 03/10/2017 0924   BILIRUBINUR NEGATIVE 03/10/2017 0924   KETONESUR 5 (A) 03/10/2017 0924   PROTEINUR 30 (A) 03/10/2017 0924   NITRITE NEGATIVE 03/10/2017 0924   LEUKOCYTESUR NEGATIVE 03/10/2017 0924   Recent Results (from the past 240 hour(s))  Surgical pcr  screen     Status: None   Collection Time: 03/09/17  8:10 PM  Result Value Ref Range Status   MRSA, PCR NEGATIVE NEGATIVE Final   Staphylococcus aureus NEGATIVE NEGATIVE Final    Comment: (NOTE) The Xpert SA Assay (FDA approved for NASAL specimens in patients 13 years of age and older), is one component of a comprehensive surveillance program. It is not intended to diagnose infection nor to guide or monitor treatment. Performed at [facility], [address] [place], [place] [address]       Radiology Studies: Pelvis Portable  Result Date: 03/10/2017 CLINICAL DATA:  Right hip replacement EXAM: PORTABLE PELVIS 1-2 VIEWS COMPARISON:  None. FINDINGS: Interval right hip arthroplasty without failure or complication. Postsurgical changes in the surrounding soft tissues. Left hip arthroplasty without failure or complication. IMPRESSION: Interval right hip arthroplasty without failure or complication. Electronically Signed   By: Elige Ko   On: 03/10/2017 15:42    Scheduled Meds: . aspirin EC  81 mg Oral Daily  . enoxaparin (LOVENOX) injection  40 mg Subcutaneous Q24H  . fludrocortisone  0.2 mg Oral  Daily  . hydrocortisone sod succinate (SOLU-CORTEF) inj  50 mg Intravenous Q12H  . Melatonin  3 mg Oral QHS  . memantine  5 mg Oral QHS  . methocarbamol  500 mg Intramuscular Once  . mirtazapine  15 mg Oral QHS  . sodium chloride flush  3 mL Intravenous Q12H   Continuous Infusions: . 0.9 % NaCl with KCl 40 mEq / L 75 mL/hr (03/10/17 0940)     LOS: 2 days   Time spent: 25 minutes.  Hazeline Junkeryan Grunz, MD [facility] Pager [phone]  If 7PM-7AM, please contact night-coverage [email or web address] Password [email or web address] 03/11/2017, 2:14 PM

## 2017-03-11 NOTE — Progress Notes (Signed)
Patient appears confused and attempting to get out of bed.  Patient has pulled his surgical dressing off from the right hip area.  Sterile dressing reapplied.  Bed remains in low position with matts on both side of the bed and side rails are up x 4, call light within reach.  Attempted to reorient the patient with verbal and demonstration technique.  Press photographerCharge Nurse. Ginger has been made aware of the patient being in a confused state and trying to get out of bed.  Will continue to keep patient monitored closely.

## 2017-03-11 NOTE — Evaluation (Signed)
Occupational Therapy Evaluation Patient Details Name: Joe Mcpherson MRN: [id number] DOB: 04/30/1924 Today's Date: 03/11/2017    History of Present Illness Pt is a 84 y.o. male who presented to the ED after a mechanical fall and found to have a R femoral neck fracture. Now s/p R bipolar hip hemiarthroplasty. PMH significant for orthostatic hypotension, SVT, dementia, and hypertension.    Clinical Impression   Per chart, pt was at SNF prior to admission. He was unable to report PLOF this session due to significant confusion. Pt demonstrating focused attention and requiring cues and increased time to follow one-step commands. He was restless at times and was easily overstimulated with auditory and visual input. Pt requiring total assist for LB ADL while adhering to posterior hip precautions, total assist for toileting hygiene, and min assist +2 for sit<>stand in preparation for improved functional mobility. Unsafe to progress with ambulation this session due to significant posterior lean. Applied abduction pillow at end of session once returned to bed in order to maximize safe positioning. Pt would benefit from continued OT services while admitted to improve independence and safety with ADL and functional mobility. Recommend SNF level rehabilitation post-acute D/C.     Follow Up Recommendations  SNF;Supervision/Assistance - 24 hour    Equipment Recommendations  Other (comment)(TBD at next venue of care)    Recommendations for Other Services       Precautions / Restrictions Precautions Precautions: Posterior Hip;Fall Precaution Booklet Issued: No Precaution Comments: Limited understanding of precautions due to cognitive deficits Restrictions Weight Bearing Restrictions: Yes RLE Weight Bearing: Weight bearing as tolerated      Mobility Bed Mobility Overal bed mobility: Needs Assistance Bed Mobility: Supine to Sit;Sit to Supine     Supine to sit: Min assist;+2 for physical  assistance Sit to supine: Max assist;+2 for physical assistance   General bed mobility comments: Increased assist to return to bed due to cognition and restlessness.   Transfers Overall transfer level: Needs assistance Equipment used: 2 person hand held assist Transfers: Sit to/from Stand Sit to Stand: Min assist;+2 physical assistance;+2 safety/equipment         General transfer comment: Min assist +2 to power up safely. Pt confused and restless making him unsafe at times. Easily distracted during transfer by external visual stimuli.     Balance Overall balance assessment: Needs assistance Sitting-balance support: No upper extremity supported;Feet supported Sitting balance-Leahy Scale: Fair     Standing balance support: Bilateral upper extremity supported;During functional activity;No upper extremity supported;Single extremity supported Standing balance-Leahy Scale: Poor Standing balance comment: Posterior lean and supporting himself with BLE pressed against bed. Reports "I will fall forward" when asked to lean forward and support himself with therapists hand.                            ADL either performed or assessed with clinical judgement   ADL Overall ADL's : Needs assistance/impaired Eating/Feeding: Sitting;Minimal assistance   Grooming: Minimal assistance;Sitting Grooming Details (indicate cue type and reason): Able to wash his face on command Upper Body Bathing: Moderate assistance;Sitting   Lower Body Bathing: Total assistance;Adhering to hip precautions   Upper Body Dressing : Moderate assistance   Lower Body Dressing: Total assistance;Adhering to hip precautions;Sit to/from Market researcherstand     Toilet Transfer Details (indicate cue type and reason): Able to complete sit<>stand with min assist +2 for safety. Limited by posterior lean and unsafe to attempt toilet transfer.  Toileting- ArchitectClothing Manipulation  and Hygiene: Total assistance;Sit to/from stand          General ADL Comments: Pt requiring constant cueing and redirection for safety.      Vision   Additional Comments: No visual deficits noted but will continue to assess     Perception     Praxis      Pertinent Vitals/Pain       Hand Dominance     Extremity/Trunk Assessment Upper Extremity Assessment Upper Extremity Assessment: Generalized weakness   Lower Extremity Assessment Lower Extremity Assessment: Defer to PT evaluation;RLE deficits/detail RLE Deficits / Details: s/p bipolar hemiarthroplasty; no grimacing noted with movement and pt mocing R LE freely       Communication     Cognition Arousal/Alertness: Awake/alert;Lethargic(fluctuates) Behavior During Therapy: Restless Overall Cognitive Status: Impaired/Different from baseline Area of Impairment: Orientation;Attention;Memory;Following commands;Safety/judgement;Awareness;Problem solving                 Orientation Level: Disoriented to;Place;Time;Situation Current Attention Level: Focused Memory: Decreased recall of precautions;Decreased short-term memory Following Commands: Follows one step commands inconsistently Safety/Judgement: Decreased awareness of safety;Decreased awareness of deficits Awareness: Intellectual(pre-intellectual) Problem Solving: Slow processing;Decreased initiation;Difficulty sequencing;Requires verbal cues;Requires tactile cues General Comments: Pt fidgeting with all cloth around him and attempting to leave his bed but unable to report where he was attempting to go. Suspect pt overstimulated with sensory input as he did ask therapist to be quiet.    General Comments  Fluctuating between alert/restless state and sleeping this session.    Exercises     Shoulder Instructions      Home Living Family/patient expects to be discharged to:: Skilled nursing facility                                 Additional Comments: Per chart, pt has been staying at a SNF       Prior Functioning/Environment Level of Independence: Needs assistance  Gait / Transfers Assistance Needed: Unable to report mobility PLOF due to confusion. ADL's / Homemaking Assistance Needed: Likely requires staff assistance at SNF but unable to report PLOF.             OT Problem List: Decreased strength;Decreased range of motion;Decreased activity tolerance;Impaired balance (sitting and/or standing);Decreased safety awareness;Decreased knowledge of use of DME or AE;Decreased knowledge of precautions;Decreased cognition;Decreased coordination;Pain      OT Treatment/Interventions: Self-care/ADL training;Therapeutic exercise;Energy conservation;DME and/or AE instruction;Therapeutic activities;Cognitive remediation/compensation;Visual/perceptual remediation/compensation;Patient/family education;Balance training    OT Goals(Current goals can be found in the care plan Mcpherson) Acute Rehab OT Goals Patient Stated Goal: unable to state; did report he wanted to watch his remote OT Goal Formulation: Patient unable to participate in goal setting Time For Goal Achievement: 03/25/17 Potential to Achieve Goals: Fair ADL Goals Pt Will Perform Eating: with set-up;sitting Pt Will Perform Grooming: with set-up;sitting Pt Will Transfer to Toilet: with min guard assist;ambulating;bedside commode Pt Will Perform Toileting - Clothing Manipulation and hygiene: with min assist;sit to/from stand Additional ADL Goal #1: Pt will sustain attention to task during seated grooming tasks in a non-distracting environment with no more than 2 VC.  OT Frequency: Min 2X/week   Barriers to D/C:            Co-evaluation              AM-PAC PT "6 Clicks" Daily Activity     Outcome Measure Help from another person eating meals?: A Little Help from another person  taking care of personal grooming?: A Little Help from another person toileting, which includes using toliet, bedpan, or urinal?: Total Help from  another person bathing (including washing, rinsing, drying)?: A Lot Help from another person to put on and taking off regular upper body clothing?: A Lot Help from another person to put on and taking off regular lower body clothing?: Total 6 Click Score: 12   End of Session Equipment Utilized During Treatment: (pt standing too quickly to apply gait belt) Nurse Communication: Mobility status(nursing student and RN)  Activity Tolerance: Patient tolerated treatment well Patient left: in bed;with call bell/phone within reach;with bed alarm set  OT Visit Diagnosis: Other abnormalities of gait and mobility (R26.89);Other symptoms and signs involving cognitive function;Pain Pain - Right/Left: Right Pain - part of body: Hip                Time: 9811-9147 OT Time Calculation (min): 27 min Charges:  OT General Charges $OT Visit: 1 Visit OT Evaluation $OT Eval Moderate Complexity: 1 Mod G-Codes:     Joe Mcpherson, Joe Mcpherson  Pager: [phone]   Joe Mcpherson 03/11/2017, 5:48 PM

## 2017-03-12 LAB — CBC
HEMATOCRIT: 23.4 % — AB (ref 39.0–52.0)
HEMOGLOBIN: 7.6 g/dL — AB (ref 13.0–17.0)
MCH: 31 pg (ref 26.0–34.0)
MCHC: 32.5 g/dL (ref 30.0–36.0)
MCV: 95.5 fL (ref 78.0–100.0)
Platelets: 132 10*3/uL — ABNORMAL LOW (ref 150–400)
RBC: 2.45 MIL/uL — AB (ref 4.22–5.81)
RDW: 13.6 % (ref 11.5–15.5)
WBC: 6.9 10*3/uL (ref 4.0–10.5)

## 2017-03-12 LAB — BASIC METABOLIC PANEL
Anion gap: 9 (ref 5–15)
BUN: 26 mg/dL — ABNORMAL HIGH (ref 6–20)
CALCIUM: 8.5 mg/dL — AB (ref 8.9–10.3)
CO2: 27 mmol/L (ref 22–32)
Chloride: 109 mmol/L (ref 101–111)
Creatinine, Ser: 0.98 mg/dL (ref 0.61–1.24)
GFR calc non Af Amer: >60 mL/min (ref >60)
GLUCOSE: 108 mg/dL — AB (ref 65–99)
POTASSIUM: 3.5 mmol/L (ref 3.5–5.1)
Sodium: 145 mmol/L (ref 135–145)

## 2017-03-12 NOTE — Progress Notes (Signed)
PROGRESS NOTE  Joe Mcpherson  PIR:[id number] DOB: 1924-08-09 DOA: 03/09/2017 PCP: Default, Provider, MD  Brief Narrative: Joe Mcpherson is a 84 y.o. male with a history of dementia, CAD s/p PCI, HTN, orthostatic hypotension on florinef who presented to the ED after a mechanical fall found to have a right femoral neck fracture. Orthopedics, Dr. Renaye Rakers, performed right hemiarthroplasty 2/15. Post-operative course has been complicated by acute delirium.   Assessment & Plan: Principal Problem:   Hip fracture requiring operative repair [facility]) Active Problems:   Hip fracture, unspecified laterality, closed, initial encounter (HCC)   CAD (coronary artery disease)   S/P angioplasty with stent   HTN (hypertension)   Dementia   Orthostasis  Right femoral neck fracture: s/p hemiarthroplasty 2/15 by Dr. Eulah Pont.  - WBAT RLE, PT/OT ordered - Pain control as ordered.  Acute delirium on chronic dementia: Improving, no agitation/falls noted overnight. - Delirium precautions. Pain is controlled, limiting provocative medications as able. - Continue prn mittens and low dose IV ativan prn refractory agitation.  - Continue home namenda  Dysphagia: SLP evaluated patient. No aspiration noted, though pt's cognitive status and postoperative condition/pain put at elevated risk. - Dysphagia 1 (puree) diet with thin liquids.  - Continue SLP at SNF  Leukocytosis: Suspect reactive to fall, fracture, resolved. No fever. RUL markings noted on CXR though does not have clinical evidence of pneumonia. - Monitor fever curve off abx. Culture if fever develops.  Acute blood loss anemia: Due to surgery 2/15. Hgb 11.5 > 8.1 > 7.6. No evidence of ongoing bleeding and vital signs are stable, so also suspect some hemodilutional component.  - Stabilizing. Will recheck CBC in AM.   Thrombocytopenia: Mild.  - Monitor in AM  AKI: SCr up to 1.30 postop, returned to baseline 0.98.   - Continue IVF's, monitor UOP and BMP in  AM  Orthostatic hypotension, dysautonomia:  - Continue home fludrocortisone, tapered stress steroids 2/16.  CAD s/p angioplasty with stenting and h/o SVT:  - Continue ASA. If SVT noted, consider diltiazem  HTN:  - prn hydralazine  Hypokalemia: Resolved.  DVT prophylaxis: Lovenox per orthopedics Code Status: Full Family Communication: None at bedside today Disposition Plan: SNF. CSW consulted.  Consultants:  Orthopedics, Dr. Renaye Rakers: ARTHROPLASTY BIPOLAR HIP (HEMIARTHROPLASTY) 03/10/2017  Antimicrobials:  Ancef perioperatively   Subjective: No complaints this morning. Did not receive ativan overnight.    Objective: Vitals:   03/11/17 0755 03/11/17 1330 03/11/17 2027 03/12/17 0511  BP: 140/60 (!) 150/91 135/62 130/61  Pulse: 76 (!) 117 94 80  Resp: 16 18 19 17   Temp: 98 F (36.7 C) 98.2 F (36.8 C)  97.6 F (36.4 C)  TempSrc:  Oral  Axillary  SpO2: 96% 99% 96% 94%  Weight:      Height:       No intake or output data in the 24 hours ending 03/12/17 1028 Filed Weights   03/10/17 1040  Weight: 85.3 kg (188 lb)    Gen: Elderly male in no distress, resting quietly.  Pulm: Non-labored breathing room air. Clear to auscultation bilaterally.  CV: Regular rate and rhythm. No murmur, rub, or gallop. No JVD, no pedal edema. GI: Abdomen soft, non-tender, non-distended, with normoactive bowel sounds. No organomegaly or masses felt. Ext: Warm, no deformities. RLE with painless ROM, dressings c/d/i, compartment soft. Sensorimotor function intact distally. Skin: No rashes, lesions ulcers Neuro: Alert, oriented to place but not time. No focal neurological deficits. Psych: Judgement and insight appear impaired. No agitation.  Data Reviewed: I have personally reviewed following labs and imaging studies  CBC: Recent Labs  Lab 03/09/17 1038 03/10/17 0535 03/11/17 0435 03/12/17 0608  WBC 6.4 15.6* 11.0* 6.9  NEUTROABS 5.0  --   --   --   HGB 11.2* 11.5* 8.1* 7.6*  HCT  34.2* 34.5* 25.1* 23.4*  MCV 94.7 94.0 94.4 95.5  PLT 184 174 145* 132*   Basic Metabolic Panel: Recent Labs  Lab 03/09/17 1038 03/10/17 0535 03/11/17 0435 03/12/17 0608  NA 142 143 142 145  K 3.4* 3.1* 3.6 3.5  CL 107 105 104 109  CO2 25 28 26 27   GLUCOSE 88 136* 163* 108*  BUN 19 24* 30* 26*  CREATININE 1.00 0.98 1.30* 0.98  CALCIUM 9.1 9.1 8.6* 8.5*   GFR: Estimated Creatinine Clearance: 58.2 mL/min (by C-G formula based on SCr of 0.98 mg/dL). Liver Function Tests: No results for input(s): AST, ALT, ALKPHOS, BILITOT, PROT, ALBUMIN in the last 168 hours. No results for input(s): LIPASE, AMYLASE in the last 168 hours. No results for input(s): AMMONIA in the last 168 hours. Coagulation Profile: Recent Labs  Lab 03/09/17 1214 03/10/17 0535  INR 0.99 1.14   Cardiac Enzymes: No results for input(s): CKTOTAL, CKMB, CKMBINDEX, TROPONINI in the last 168 hours. BNP (last 3 results) No results for input(s): PROBNP in the last 8760 hours. HbA1C: No results for input(s): HGBA1C in the last 72 hours. CBG: No results for input(s): GLUCAP in the last 168 hours. Lipid Profile: No results for input(s): CHOL, HDL, LDLCALC, TRIG, CHOLHDL, LDLDIRECT in the last 72 hours. Thyroid Function Tests: No results for input(s): TSH, T4TOTAL, FREET4, T3FREE, THYROIDAB in the last 72 hours. Anemia Panel: No results for input(s): VITAMINB12, FOLATE, FERRITIN, TIBC, IRON, RETICCTPCT in the last 72 hours. Urine analysis:    Component Value Date/Time   COLORURINE YELLOW 03/10/2017 0924   APPEARANCEUR CLEAR 03/10/2017 0924   LABSPEC 1.027 03/10/2017 0924   PHURINE 5.0 03/10/2017 0924   GLUCOSEU NEGATIVE 03/10/2017 0924   HGBUR NEGATIVE 03/10/2017 0924   BILIRUBINUR NEGATIVE 03/10/2017 0924   KETONESUR 5 (A) 03/10/2017 0924   PROTEINUR 30 (A) 03/10/2017 0924   NITRITE NEGATIVE 03/10/2017 0924   LEUKOCYTESUR NEGATIVE 03/10/2017 0924   Recent Results (from the past 240 hour(s))  Surgical  pcr screen     Status: None   Collection Time: 03/09/17  8:10 PM  Result Value Ref Range Status   MRSA, PCR NEGATIVE NEGATIVE Final   Staphylococcus aureus NEGATIVE NEGATIVE Final    Comment: (NOTE) The Xpert SA Assay (FDA approved for NASAL specimens in patients 82 years of age and older), is one component of a comprehensive surveillance program. It is not intended to diagnose infection nor to guide or monitor treatment. Performed at [facility], [address] [place], [place] [id number]       Radiology Studies: Pelvis Portable  Result Date: 03/10/2017 CLINICAL DATA:  Right hip replacement EXAM: PORTABLE PELVIS 1-2 VIEWS COMPARISON:  None. FINDINGS: Interval right hip arthroplasty without failure or complication. Postsurgical changes in the surrounding soft tissues. Left hip arthroplasty without failure or complication. IMPRESSION: Interval right hip arthroplasty without failure or complication. Electronically Signed   By: Elige KoHetal  Patel   On: 03/10/2017 15:42    Scheduled Meds: . aspirin EC  81 mg Oral Daily  . enoxaparin (LOVENOX) injection  40 mg Subcutaneous Q24H  . fludrocortisone  0.2 mg Oral Daily  . Melatonin  3 mg Oral QHS  .  memantine  5 mg Oral QHS  . methocarbamol  500 mg Intramuscular Once  . mirtazapine  15 mg Oral QHS  . sodium chloride flush  3 mL Intravenous Q12H   Continuous Infusions:    LOS: 3 days   Time spent: 25 minutes.  Hazeline Junker, MD [facility] Pager [phone]  If 7PM-7AM, please contact night-coverage [email or web address] Password [facility] 03/12/2017, 10:28 AM

## 2017-03-12 NOTE — Plan of Care (Signed)
Charge RN notified scanner in room is not scanning either arm band or meds.

## 2017-03-12 NOTE — Clinical Social Work Note (Signed)
Clinical Social Work Assessment  Patient Details  Name: Joe Mcpherson MRN: [id number] Date of Birth: [birth year]  Date of referral:  03/12/17               Reason for consult:  Facility Placement                Permission sought to share information with:  Family Supports Permission granted to share information::     Name::      Joe Mcpherson  Agency::     Relationship::  IT consultanteice  Contact Information:     Housing/Transportation Living arrangements for the past 2 months:  Assisted Living Facility Source of Information:  Other (Comment Required)(Neice) Patient Interpreter Needed:  None Criminal Activity/Legal Involvement Pertinent to Current Situation/Hospitalization:  No - Comment as needed Significant Relationships:  Other Family Members Lives with:  Self Do you feel safe going back to the place where you live?    Need for family participation in patient care:     Care giving concerns:  Pt is disoriented x4. Only family contact on file is pt's niece. CSW spoke with pt's niece on the phone. Prior to hospitalization pt lived at [facility].   Social Worker assessment / plan:  Per pt's niece pt had just gotten into [facility] prior to admission. Pt's niece states they were in the process of getting pt into the memory care unit. CSW explained PT is recommending SNF at this time--Pt's niece understanding. Pt's niece agreeable for pt to be faxed out to [place] facilities.  Employment status:  Retired Database administratornsurance information:  Managed Medicare PT Recommendations:  Skilled Nursing Facility Information / Referral to community resources:  Skilled Nursing Facility  Patient/Family's Response to care:  Pt's neice verbalized understanding of CSW role and expressed appreciation for support. Pt's neice denies any concern regarding pt care at this time.   Patient/Family's Understanding of and Emotional Response to Diagnosis, Current Treatment, and Prognosis:  Pt's neice understanding and realistic  regarding pt's physical limitations. Pt's neice understands the need for SNF placement for pt at d/c. Pt's neice agreeable for pt to go to SNF at d/c, at this time.  Pt's neice denies any concern regarding treatment plan at this time. CSW will continue to provide support and facilitate d/c needs.   Emotional Assessment Appearance:  Appears stated age Attitude/Demeanor/Rapport:  Unable to Assess Affect (typically observed):  Unable to Assess Orientation:  (Disorriented x4) Alcohol / Substance use:  Not Applicable Psych involvement (Current and /or in the community):  No (Comment)  Discharge Needs  Concerns to be addressed:  Care Coordination, Basic Needs Readmission within the last 30 days:  No Current discharge risk:  Dependent with Mobility Barriers to Discharge:  Continued Medical Work up   Pacific MutualBridget A Niurka Benecke, LCSW 03/12/2017, 3:27 PM

## 2017-03-12 NOTE — Evaluation (Signed)
Clinical/Bedside Swallow Evaluation Patient Details  Name: Joe Mcpherson MRN: [id number] Date of Birth: [birth year]  Today's Date: 03/12/2017 Time: SLP Start Time (ACUTE ONLY): 4098 SLP Stop Time (ACUTE ONLY): 0942 SLP Time Calculation (min) (ACUTE ONLY): 17 min  Past Medical History:  Past Medical History:  Diagnosis Date  . CAD (coronary artery disease) 03/09/2017  . Coronary artery disease   . Dementia 03/09/2017  . Hip fracture, unspecified laterality, closed, initial encounter (HCC) 03/09/2017  . HTN (hypertension) 03/09/2017  . Hypertension   . Orthostasis 03/09/2017  . S/P angioplasty with stent 03/09/2017   Past Surgical History:  Past Surgical History:  Procedure Laterality Date  . CORONARY STENT PLACEMENT     HPI:  Joe Mcpherson is a 84 y.o. male PMH oforthostatic hypotension on aldosterone antagonist, coronary artery disease status post stenting, SVT, dementia, hypertension who comes in after having a mechanical fall onto his right hip. Post-operative course complicated by acute delirium. CXR showed "COPD. Slightly increased right upper lobe lung markings as compared to the previous study may reflect atelectasis or early pneumonia." Also with leukocytosis which is suspected to b e reactive to fall/ fracture.   Assessment / Plan / Recommendation Clinical Impression  Joe Mcpherson's cognitive status and pain negatively impact his swallow function/ attention to bolus at this time, which also limited evaluation. Joe Mcpherson with poor awareness of boluses; coughed x1 out of 3 trials of thin liquid, refused anymore consumption, unable to sip from straw at this time. No s/s of aspiration with trial of puree; however, Joe Mcpherson did hold in mouth for several seconds and needed verbal/ tactile cues to initiate swallow. RN indicated that past date, Joe Mcpherson appeared to tolerate thin liquids well. Recommend downgrading diet to dysphagia 1 and thin liquids, meds crushed in puree, full supervision to assist with feeding, ensure Joe Mcpherson alert and  attending to POs, check for oral holding. Will f/u as time allows to check for changes/ improvements in cognitive status as this evaluation was limited. Continue to follow.  SLP Visit Diagnosis: Dysphagia, unspecified (R13.10)    Aspiration Risk  Moderate aspiration risk    Diet Recommendation Dysphagia 1 (Puree);Thin liquid   Liquid Administration via: Cup Medication Administration: Crushed with puree Supervision: Staff to assist with self feeding;Full supervision/cueing for compensatory strategies Compensations: Minimize environmental distractions;Slow rate;Small sips/bites;Lingual sweep for clearance of pocketing Postural Changes: Seated upright at 90 degrees    Other  Recommendations Oral Care Recommendations: Oral care BID   Follow up Recommendations Skilled Nursing facility      Frequency and Duration min 2x/week  1 week       Prognosis Prognosis for Safe Diet Advancement: Good Barriers to Reach Goals: Cognitive deficits      Swallow Study   General HPI: Joe Mcpherson is a 84 y.o. male PMH oforthostatic hypotension on aldosterone antagonist, coronary artery disease status post stenting, SVT, dementia, hypertension who comes in after having a mechanical fall onto his right hip. Post-operative course complicated by acute delirium. CXR showed "COPD. Slightly increased right upper lobe lung markings as compared to the previous study may reflect atelectasis or early pneumonia." Also with leukocytosis which is suspected to b e reactive to fall/ fracture. Type of Study: Bedside Swallow Evaluation Previous Swallow Assessment: none in chart Diet Prior to this Study: Regular;Thin liquids Temperature Spikes Noted: No Respiratory Status: Room air History of Recent Intubation: No Behavior/Cognition: Impulsive;Lethargic/Drowsy;Requires cueing Oral Cavity Assessment: Within Functional Limits Oral Care Completed by SLP: No Oral Cavity - Dentition: Adequate natural dentition Self-Feeding  Abilities: Total assist Patient Positioning: Upright in bed Baseline Vocal Quality: Normal Volitional Cough: Cognitively unable to elicit Volitional Swallow: Unable to elicit    Oral/Motor/Sensory Function Overall Oral Motor/Sensory Function: Other (comment)(difficult to assess)   Ice Chips Ice chips: Not tested   Thin Liquid Thin Liquid: Impaired Presentation: Cup;Spoon Oral Phase Impairments: Poor awareness of bolus Pharyngeal  Phase Impairments: Cough - Immediate    Nectar Thick Nectar Thick Liquid: Not tested   Honey Thick Honey Thick Liquid: Not tested   Puree Puree: Impaired Presentation: Spoon Oral Phase Impairments: Poor awareness of bolus Oral Phase Functional Implications: Oral holding   Solid   GO   Solid: Not tested        Metro KungAmy K Nira Visscher, MA, CCC-SLP 03/12/2017,9:52 AM  [phone]

## 2017-03-12 NOTE — Progress Notes (Signed)
Patient ID: Joe SignsRobert Mcpherson, male   DOB: [birth year], 84 y.o.   MRN: [id number]     Subjective:  Patient reports pain as mild to moderate.  Patient confused and unaware of time and place  Objective:   VITALS:   Vitals:   03/11/17 0755 03/11/17 1330 03/11/17 2027 03/12/17 0511  BP: 140/60 (!) 150/91 135/62 130/61  Pulse: 76 (!) 117 94 80  Resp: 16 18 19 17   Temp: 98 F (36.7 C) 98.2 F (36.8 C)  97.6 F (36.4 C)  TempSrc:  Oral  Axillary  SpO2: 96% 99% 96% 94%  Weight:      Height:        ABD soft Sensation intact distally Dorsiflexion/Plantar flexion intact Incision: dressing C/D/I and no drainage   Lab Results  Component Value Date   WBC 6.9 03/12/2017   HGB 7.6 (L) 03/12/2017   HCT 23.4 (L) 03/12/2017   MCV 95.5 03/12/2017   PLT 132 (L) 03/12/2017   BMET    Component Value Date/Time   NA 145 03/12/2017 0608   K 3.5 03/12/2017 0608   CL 109 03/12/2017 0608   CO2 27 03/12/2017 0608   GLUCOSE 108 (H) 03/12/2017 0608   BUN 26 (H) 03/12/2017 0608   CREATININE 0.98 03/12/2017 0608   CALCIUM 8.5 (L) 03/12/2017 0608   GFRNONAA >60 03/12/2017 0608   GFRAA >60 03/12/2017 0608     Assessment/Plan: 2 Days Post-Op   Principal Problem:   Hip fracture requiring operative repair [facility](HCC) Active Problems:   Hip fracture, unspecified laterality, closed, initial encounter (HCC)   CAD (coronary artery disease)   S/P angioplasty with stent   HTN (hypertension)   Dementia   Orthostasis   Advance diet Up with therapy ABLA  Continue plan per medicine  WBAT Dry dressing PRN    Haskel KhanDOUGLAS Stylianos Stradling 03/12/2017, 4:28 PM   Teryl LucyJoshua Landau, MD Cell [phone]

## 2017-03-12 NOTE — [place] FL2 (Signed)
[place] MEDICAID FL2 LEVEL OF CARE SCREENING TOOL     IDENTIFICATION  Patient Name: Joe Mcpherson Birthdate: 1924-03-07 Sex: male Admission Date (Current Location): 03/09/2017  [facility] and [place] Number:  Producer, television/film/video and Address:  [facility], [address] [place], [place] [address]      Provider Number: [id number]  Attending Physician Name and Address:  Tyrone Nine, MD  Relative Name and Phone Number:       Current Level of Care: Hospital Recommended Level of Care: Skilled Nursing Facility Prior Approval Number:    Date Approved/Denied:   PASRR Number:  [id number]   Discharge Plan: SNF    Current Diagnoses: Patient Active Problem List   Diagnosis Date Noted  . Hip fracture, unspecified laterality, closed, initial encounter (HCC) 03/09/2017  . Hip fracture requiring operative repair (HCC) 03/09/2017  . CAD (coronary artery disease) 03/09/2017  . S/P angioplasty with stent 03/09/2017  . HTN (hypertension) 03/09/2017  . Dementia 03/09/2017  . SVT (supraventricular tachycardia) (HCC) 03/09/2017  . Orthostasis 03/09/2017  . Acute lower GI bleeding 02/25/2016  . Orthostatic syncope 12/27/2015    Orientation RESPIRATION BLADDER Height & Weight     Self  Normal Incontinent Weight: 188 lb (85.3 kg) Height:  6\' 2"  (188 cm)  BEHAVIORAL SYMPTOMS/MOOD NEUROLOGICAL BOWEL NUTRITION STATUS      Continent (Please see d/c summary)  AMBULATORY STATUS COMMUNICATION OF NEEDS Skin   Extensive Assist Verbally Surgical wounds(Closed incision right hip; Silver hydrofiber)                       Personal Care Assistance Level of Assistance  Bathing, Feeding, Dressing Bathing Assistance: Maximum assistance Feeding assistance: Limited assistance Dressing Assistance: Maximum assistance     Functional Limitations Info  Sight, Hearing, Speech Sight Info: Adequate Hearing Info: Adequate Speech Info: Adequate    SPECIAL CARE FACTORS  FREQUENCY  PT (By licensed PT), OT (By licensed OT)     PT Frequency: 3x OT Frequency: 3x            Contractures Contractures Info: Not present    Additional Factors Info  Code Status, Allergies Code Status Info: Full Code Allergies Info: No known allergies           Current Medications (03/12/2017):  This is the current hospital active medication list Current Facility-Administered Medications  Medication Dose Route Frequency Provider Last Rate Last Dose  . acetaminophen (TYLENOL) tablet 650 mg  650 mg Oral Q6H PRN Purohit, Shrey C, MD   650 mg at 03/09/17 1957   Or  . acetaminophen (TYLENOL) suppository 650 mg  650 mg Rectal Q6H PRN Purohit, Shrey C, MD      . aspirin EC tablet 81 mg  81 mg Oral Daily Purohit, Shrey C, MD   81 mg at 03/11/17 0942  . enoxaparin (LOVENOX) injection 40 mg  40 mg Subcutaneous Q24H Purohit, Shrey C, MD   40 mg at 03/11/17 2251  . fludrocortisone (FLORINEF) tablet 0.2 mg  0.2 mg Oral Daily Purohit, Shrey C, MD   0.2 mg at 03/11/17 0943  . hydrALAZINE (APRESOLINE) injection 5 mg  5 mg Intravenous Q4H PRN Dorothea Ogle, MD      . HYDROcodone-acetaminophen (NORCO/VICODIN) 5-325 MG per tablet 1-2 tablet  1-2 tablet Oral Q6H PRN Albina Billet III, PA-C   2 tablet at 03/11/17 2251  . ketorolac (TORADOL) 15 MG/ML injection 15 mg  15 mg  Intravenous Q6H PRN Purohit, Salli QuarryShrey C, MD   15 mg at 03/11/17 1233  . LORazepam (ATIVAN) injection 0.5 mg  0.5 mg Intravenous Q4H PRN Tyrone NineGrunz, Ryan B, MD   0.5 mg at 03/11/17 1813  . Melatonin TABS 3 mg  3 mg Oral QHS Purohit, Shrey C, MD   3 mg at 03/11/17 2251  . memantine (NAMENDA) tablet 5 mg  5 mg Oral QHS Purohit, Shrey C, MD   5 mg at 03/11/17 2251  . methocarbamol (ROBAXIN) injection 500 mg  500 mg Intramuscular Once Cardama, Amadeo GarnetPedro Eduardo, MD      . mirtazapine (REMERON) tablet 15 mg  15 mg Oral QHS Purohit, Shrey C, MD   15 mg at 03/11/17 2251  . ondansetron (ZOFRAN) tablet 4 mg  4 mg Oral Q6H PRN Purohit,  Shrey C, MD       Or  . ondansetron (ZOFRAN) injection 4 mg  4 mg Intravenous Q6H PRN Purohit, Shrey C, MD      . polyethylene glycol (MIRALAX / GLYCOLAX) packet 17 g  17 g Oral Daily PRN Purohit, Shrey C, MD      . sodium chloride flush (NS) 0.9 % injection 3 mL  3 mL Intravenous Q12H Purohit, Shrey C, MD   3 mL at 03/12/17 0958  . sorbitol 70 % solution 30 mL  30 mL Oral Daily PRN Purohit, Salli QuarryShrey C, MD         Discharge Medications: Please see discharge summary for a list of discharge medications.  Relevant Imaging Results:  Relevant Lab Results:   Additional Information SSN: [id number]  Maree KrabbeBridget A Tessy Pawelski, LCSW

## 2017-03-13 ENCOUNTER — Encounter (HOSPITAL_COMMUNITY): Payer: Self-pay | Admitting: Orthopedic Surgery

## 2017-03-13 LAB — CBC
HCT: 26.2 % — ABNORMAL LOW (ref 39.0–52.0)
Hemoglobin: 8.3 g/dL — ABNORMAL LOW (ref 13.0–17.0)
MCH: 30.1 pg (ref 26.0–34.0)
MCHC: 31.7 g/dL (ref 30.0–36.0)
MCV: 94.9 fL (ref 78.0–100.0)
PLATELETS: 193 10*3/uL (ref 150–400)
RBC: 2.76 MIL/uL — ABNORMAL LOW (ref 4.22–5.81)
RDW: 13.2 % (ref 11.5–15.5)
WBC: 8.9 10*3/uL (ref 4.0–10.5)

## 2017-03-13 MED ORDER — LORAZEPAM 2 MG/ML IJ SOLN
0.2500 mg | INTRAMUSCULAR | Status: DC | PRN
  Administered 2017-03-14 (×2): 0.25 mg via INTRAVENOUS
  Filled 2017-03-13 (×2): qty 1

## 2017-03-13 MED ORDER — POTASSIUM CHLORIDE 2 MEQ/ML IV SOLN
INTRAVENOUS | Status: DC
  Administered 2017-03-13: 16:00:00 via INTRAVENOUS
  Filled 2017-03-13 (×5): qty 1000

## 2017-03-13 NOTE — Plan of Care (Signed)
  Progressing Education: Knowledge of General Education information will improve 03/13/2017 1532 - Progressing by Darreld Mcleanox, Braylen Denunzio, RN Health Behavior/Discharge Planning: Ability to manage health-related needs will improve 03/13/2017 1532 - Progressing by Darreld Mcleanox, Roi Jafari, RN Clinical Measurements: Ability to maintain clinical measurements within normal limits will improve 03/13/2017 1532 - Progressing by Darreld Mcleanox, Mario Coronado, RN Will remain free from infection 03/13/2017 1532 - Progressing by Darreld Mcleanox, Merranda Bolls, RN Diagnostic test results will improve 03/13/2017 1532 - Progressing by Darreld Mcleanox, Fe Okubo, RN Respiratory complications will improve 03/13/2017 1532 - Progressing by Darreld Mcleanox, Marysue Fait, RN Cardiovascular complication will be avoided 03/13/2017 1532 - Progressing by Darreld Mcleanox, Narciso Stoutenburg, RN Activity: Risk for activity intolerance will decrease 03/13/2017 1532 - Progressing by Darreld Mcleanox, Zeek Rostron, RN Nutrition: Adequate nutrition will be maintained 03/13/2017 1532 - Progressing by Darreld Mcleanox, Kyden Potash, RN Coping: Level of anxiety will decrease 03/13/2017 1532 - Progressing by Darreld Mcleanox, Karell Tukes, RN Elimination: Will not experience complications related to bowel motility 03/13/2017 1532 - Progressing by Darreld Mcleanox, Morse Brueggemann, RN Will not experience complications related to urinary retention 03/13/2017 1532 - Progressing by Darreld Mcleanox, Linna Thebeau, RN Pain Managment: General experience of comfort will improve 03/13/2017 1532 - Progressing by Darreld Mcleanox, Arienne Gartin, RN Safety: Ability to remain free from injury will improve 03/13/2017 1532 - Progressing by Darreld Mcleanox, Kechia Yahnke, RN Skin Integrity: Risk for impaired skin integrity will decrease 03/13/2017 1532 - Progressing by Darreld Mcleanox, Tayshon Winker, RN Education: Verbalization of understanding the information provided (i.e., activity precautions, restrictions, etc) will improve 03/13/2017 1532 - Progressing by Darreld Mcleanox, Kyair Ditommaso, RN Activity: Ability to ambulate and perform ADLs will improve 03/13/2017 1532 - Progressing by Darreld Mcleanox, Harol Shabazz, RN Clinical Measurements: Postoperative complications will  be avoided or minimized 03/13/2017 1532 - Progressing by Darreld Mcleanox, Collins Kerby, RN Self-Concept: Ability to maintain and perform role responsibilities to the fullest extent possible will improve 03/13/2017 1532 - Progressing by Darreld Mcleanox, Lawan Nanez, RN Pain Management: Pain level will decrease 03/13/2017 1532 - Progressing by Darreld Mcleanox, Dushawn Pusey, RN

## 2017-03-13 NOTE — Progress Notes (Signed)
SLP Cancellation Note  Patient Details Name: Joe Mcpherson MRN: [id number] DOB: 08-30-1924   Cancelled treatment:       Reason Eval/Treat Not Completed: Fatigue/lethargy limiting ability to participate. Pt received ativan this morning and is not alert enough for PO trials. WIll f/u as able.   Maxcine Hamaiewonsky, Tyera Hansley 03/13/2017, 9:57 AM   Maxcine HamLaura Paiewonsky, M.A. CCC-SLP [phone]

## 2017-03-13 NOTE — Anesthesia Postprocedure Evaluation (Signed)
Anesthesia Post Note  Patient: Gregary SignsRobert Lippold  Procedure(s) Performed: ARTHROPLASTY BIPOLAR HIP (HEMIARTHROPLASTY) (Right Hip)     Patient location during evaluation: PACU Anesthesia Type: General Level of consciousness: awake and alert Pain management: pain level controlled Vital Signs Assessment: post-procedure vital signs reviewed and stable Respiratory status: spontaneous breathing, nonlabored ventilation, respiratory function stable and patient connected to nasal cannula oxygen Cardiovascular status: blood pressure returned to baseline and stable Postop Assessment: no apparent nausea or vomiting Anesthetic complications: no    Last Vitals:  Vitals:   03/13/17 0507 03/13/17 0523  BP: (!) 178/80   Pulse:  92  Resp:    Temp:    SpO2:  100%    Last Pain:  Vitals:   03/13/17 0450  TempSrc: Axillary  PainSc:                  Trevor IhaStephen A Houser

## 2017-03-13 NOTE — Progress Notes (Signed)
    Subjective: Patient does not appear to be in pain.  Sleeping soundly on arrival.  Tolerating diet.  Urinating.    Objective:   VITALS:   Vitals:   03/13/17 0504 03/13/17 0507 03/13/17 0523 03/13/17 0555  BP: (!) 185/93 (!) 178/80  (!) 145/70  Pulse: 89  92 94  Resp:      Temp:      TempSrc:      SpO2: 100%  100%   Weight:      Height:       CBC Latest Ref Rng & Units 03/13/2017 03/12/2017 03/11/2017  WBC 4.0 - 10.5 K/uL 8.9 6.9 11.0(H)  Hemoglobin 13.0 - 17.0 g/dL 8.3(L) 7.6(L) 8.1(L)  Hematocrit 39.0 - 52.0 % 26.2(L) 23.4(L) 25.1(L)  Platelets 150 - 400 K/uL 193 132(L) 145(L)   BMP Latest Ref Rng & Units 03/12/2017 03/11/2017 03/10/2017  Glucose 65 - 99 mg/dL 960(A108(H) 540(J163(H) 811(B136(H)  BUN 6 - 20 mg/dL 14(N26(H) 82(N30(H) 56(O24(H)  Creatinine 0.61 - 1.24 mg/dL 1.300.98 8.65(H1.30(H) 8.460.98  Sodium 135 - 145 mmol/L 145 142 143  Potassium 3.5 - 5.1 mmol/L 3.5 3.6 3.1(L)  Chloride 101 - 111 mmol/L 109 104 105  CO2 22 - 32 mmol/L 27 26 28   Calcium 8.9 - 10.3 mg/dL 9.6(E8.5(L) 9.5(M8.6(L) 9.1   Intake/Output      02/17 0701 - 02/18 0700   P.O. 50   Total Intake(mL/kg) 50 (0.6)   Net +50       Urine Occurrence 4 x      Physical Exam: General: NAD.  Sleeping soundly on arrival.   MSK Neurovascularly intact Feet warm Dorsiflexion/Plantar flexion intact Incision: dressing C/D/I - new dressing / steris placed.  Dressing absent on arrival.   Assessment: 3 Days Post-Op  S/P Procedure(s) (LRB): ARTHROPLASTY BIPOLAR HIP (HEMIARTHROPLASTY) (Right) by Dr. Jewel Baizeimothy D. Eulah PontMurphy on 03/10/17  Principal Problem:   Hip fracture requiring operative repair [facility](HCC) Active Problems:   Hip fracture, unspecified laterality, closed, initial encounter (HCC)   CAD (coronary artery disease)   S/P angioplasty with stent   HTN (hypertension)   Dementia   Orthostasis  Acute Blood Loss Anemia, hemoglobin improved.  Likely with delusional component.  Platelets within normal limits.   Closed displaced right femoral neck  fracture, status post right hemiarthroplasty. Progressing  Plan: Mobilize with therapy Incentive Spirometry Apply ice PRN   Weight Bearing: Weight Bearing as Tolerated (WBAT) posterior hip precautions Dressings: prn.  VTE prophylaxis: Lovenox, SCDs, ambulation Dispo: SNF   Follow up in the office with Dr. Wandra Feinstein. Murphy in 1-2 weeks.  Please call with questions.   Joe BilletHenry Calvin Martensen III, PA-C 03/13/2017, 6:57 AM

## 2017-03-13 NOTE — Social Work (Addendum)
CSW contacted family member, niece to discuss disposition. Niece [place] in [place] and is not familiar with SNF's in [place].  CSW will send niece list of SNf offers to make a decision. CSW explained Insurance Auth process as the SNF will need to obtain auth prior to placement. CSW also explained that patient is medically ready and we will need to select SNF expeditiously so that the SNF can begin the process.  CSW will f/u.  11:58am- CSW spoke with niece and she has accepted [facility].  CSW then explained Insurance process again and role of SNF. CSW then called admission staff,Kenisha, who indicated that she would start Insurance Auth,. CSW advised SNF that family member resides [place]. [facility] will communicate with niece for admission paperwork process.  CSW will continue to follow.  Keene BreathPatricia Ambert Virrueta, LCSW Clinical Social Worker [phone]

## 2017-03-13 NOTE — Progress Notes (Signed)
PROGRESS NOTE  Joe Mcpherson  ZOX:[id number] DOB: [birth year] DOA: 03/09/2017 PCP: Default, Provider, MD  Brief Narrative: Joe Mcpherson is a 84 y.o. male with a history of dementia, CAD s/p PCI, HTN, orthostatic hypotension on florinef who presented to the ED after a mechanical fall found to have a right femoral neck fracture. Orthopedics, Dr. Renaye Rakers, performed right hemiarthroplasty 2/15. Post-operative course has been complicated by acute delirium and acute blood loss anemia which has stabilized without need for transfusion. Therapy evaluations have recommended SNF at discharge which is being arranged.  Assessment & Plan: Principal Problem:   Hip fracture requiring operative repair [facility]) Active Problems:   Hip fracture, unspecified laterality, closed, initial encounter (HCC)   CAD (coronary artery disease)   S/P angioplasty with stent   HTN (hypertension)   Dementia   Orthostasis  Right femoral neck fracture: s/p hemiarthroplasty 2/15 by Dr. Eulah Pont.  - WBAT RLE, continue rehabilitation at [facility]. - Pain control as ordered.  Acute delirium on chronic dementia: Improving, no agitation/falls noted overnight. - Delirium precautions. Pain is controlled, limiting provocative medications as able. - Continue prn mittens and low dose IV ativan (decrease to 0.25mg ) prn refractory agitation.  - Continue home namenda  Dysphagia: SLP evaluated patient. No aspiration noted, though pt's cognitive status and postoperative condition/pain put at elevated risk. - Dysphagia 1 (puree) diet with thin liquids.  - Continue SLP at SNF  Leukocytosis: Suspect reactive to fall, fracture, resolved. No fever. RUL markings noted on CXR though does not have clinical evidence of pneumonia. - Monitor fever curve off abx. Culture if fever develops.  Acute blood loss anemia: Due to surgery 2/15, has stabilized without transfusion. Hgb 11.5 > 8.1 > 7.6 > 8.3. No evidence of ongoing bleeding and vital signs are stable,  so also suspect some hemodilutional component.  - Recheck CBC in next week.  Thrombocytopenia: Mild, resolved.  AKI: SCr up to 1.30 postop, returned to baseline 0.98.   - Monitor BMP in next week.  Orthostatic hypotension, dysautonomia:  - Continue home fludrocortisone, tapered stress steroids 2/16.  CAD s/p angioplasty with stenting and h/o SVT:  - Continue ASA. If SVT noted, consider diltiazem (on med list but pt hasn't been taking)  HTN:  - prn hydralazine  Hypokalemia: Resolved.  DVT prophylaxis: Lovenox per orthopedics Code Status: Full Family Communication: None at bedside today  Disposition Plan: SNF pending insurance authorization Consultants:  Orthopedics, Dr. Renaye Rakers: ARTHROPLASTY BIPOLAR HIP (HEMIARTHROPLASTY) 03/10/2017  Antimicrobials: Ancef perioperatively   Subjective: Had some agitation last night, received ativan and is now somnolent but responsive. Awakens and denies pain or other complaints.    Objective: BP (!) 145/70 (BP Location: Right Arm)   Pulse 94   Temp 97.8 F (36.6 C) (Axillary)   Resp 20   Ht 6\' 2"  (1.88 m)   Wt 85.3 kg (188 lb)   SpO2 100%   BMI 24.14 kg/m   Gen: Elderly male in no distress, resting quietly.  Pulm: Non-labored breathing room air. Clear to auscultation bilaterally.  CV: Regular rate and rhythm. No murmur, rub, or gallop. No JVD, no pedal edema. GI: Abdomen soft, non-tender, non-distended, with normoactive bowel sounds. No organomegaly or masses felt. Ext: Warm, no deformities. RLE dressings c/d/i, compartment soft and not significantly tender. Sensorimotor function intact distally. Skin: As above, otherwise no rashes, lesions ulcers Neuro: Drowsy but rousable and oriented to person only. No focal neurological deficits. Psych: Judgement and insight appear impaired. No agitation.  CBC: Recent Labs  Lab 03/09/17 1038 03/10/17 0535 03/11/17 0435 03/12/17 0608 03/13/17 0333  WBC 6.4 15.6* 11.0* 6.9 8.9  NEUTROABS  5.0  --   --   --   --   HGB 11.2* 11.5* 8.1* 7.6* 8.3*  HCT 34.2* 34.5* 25.1* 23.4* 26.2*  MCV 94.7 94.0 94.4 95.5 94.9  PLT 184 174 145* 132* 193   Basic Metabolic Panel: Recent Labs  Lab 03/09/17 1038 03/10/17 0535 03/11/17 0435 03/12/17 0608  NA 142 143 142 145  K 3.4* 3.1* 3.6 3.5  CL 107 105 104 109  CO2 25 28 26 27   GLUCOSE 88 136* 163* 108*  BUN 19 24* 30* 26*  CREATININE 1.00 0.98 1.30* 0.98  CALCIUM 9.1 9.1 8.6* 8.5*   Coagulation Profile: Recent Labs  Lab 03/09/17 1214 03/10/17 0535  INR 0.99 1.14     LOS: 4 days   Time spent: 25 minutes.  Hazeline Junkeryan Quintrell Baze, MD [facility] [email or web address] Password [email or web address] 03/13/2017, 1:27 PM

## 2017-03-14 DIAGNOSIS — S72001D Fracture of unspecified part of neck of right femur, subsequent encounter for closed fracture with routine healing: Secondary | ICD-10-CM

## 2017-03-14 DIAGNOSIS — S72009A Fracture of unspecified part of neck of unspecified femur, initial encounter for closed fracture: Secondary | ICD-10-CM

## 2017-03-14 MED ORDER — FLUDROCORTISONE ACETATE 0.1 MG PO TABS
0.1000 mg | ORAL_TABLET | Freq: Every day | ORAL | Status: DC
  Administered 2017-03-15 – 2017-03-16 (×2): 0.1 mg via ORAL
  Filled 2017-03-14 (×3): qty 1

## 2017-03-14 MED ORDER — MIRTAZAPINE 15 MG PO TABS
7.5000 mg | ORAL_TABLET | Freq: Every day | ORAL | Status: DC
  Administered 2017-03-14 – 2017-03-19 (×6): 7.5 mg via ORAL
  Filled 2017-03-14 (×6): qty 1

## 2017-03-14 NOTE — Progress Notes (Signed)
PROGRESS NOTE  Joe Mcpherson  OZH:[id number] DOB: November 20, 1924 DOA: 03/09/2017 PCP: Default, Provider, MD  Brief Narrative: Joe Mcpherson is a 84 y.o. male with a history of dementia, CAD s/p PCI, HTN, orthostatic hypotension on florinef who presented to the ED after a mechanical fall found to have a right femoral neck fracture. Orthopedics, Dr. Renaye Rakers, performed right hemiarthroplasty 2/15. Post-operative course has been complicated by acute delirium and acute blood loss anemia which has stabilized without need for transfusion. Therapy evaluations have recommended SNF at discharge which is being arranged. -Hospitalization complicated by agitation and delirium requiring Ativan resulting in somnolence  Assessment & Plan:  Right femoral neck fracture: -Status post mechanical fall -Ortho consulting, underwent hemiarthroplasty 2/15 by Dr. Eulah Pont.  - WBAT RLE, continue rehabilitation at [facility]. -Stop narcotics due to oversedation today -Lovenox for DVT prophylaxis  Acute delirium on chronic dementia: - Delirium precautions. - Stopped narcotics, due to oversedation, hold Ativan dose since patient has been sleeping all day following 2 doses overnight- -Safety sitter at bedside -Continue home dose of namenda  Dysphagia:  -In the background of dementia -SLP evaluated patient. No aspiration noted, though pt's cognitive status and postoperative condition/pain put at elevated risk. - Dysphagia 1 (puree) diet with thin liquids.  - Continue SLP at SNF  Leukocytosis: Suspect reactive to fall, fracture, resolved.   Acute blood loss anemia:  -Due to surgery 2/15, has stabilized without transfusion -Monitor   AKI: SCr up to 1.30 postop, returned to baseline 0.98.   - Monitor BMP in next week.  Orthostatic hypotension, dysautonomia:  - BPin high normal range, I have cut down his fludrocortisone dose to 0.1 mg daily  CAD s/p angioplasty with stenting and h/o SVT:  - Continue ASA - not taking  Cardizem as listed in med list  Hypokalemia: Resolved.  DVT prophylaxis: Lovenox Code Status: Full Family Communication: None at bedside today  Disposition Plan: SNF when mentation improved  Consultants:  Orthopedics, Dr. Renaye Rakers: ARTHROPLASTY BIPOLAR HIP (HEMIARTHROPLASTY) 03/10/2017  Antimicrobials: Ancef perioperatively   Subjective: -Remain somnolent, received 2 doses of IV Ativan overnight for agitation  Objective: BP (!) 153/84 (BP Location: Right Arm)   Pulse 85   Temp 97.6 F (36.4 C) (Axillary)   Resp 16   Ht 6\' 2"  (1.88 m)   Wt 85.3 kg (188 lb)   SpO2 98%   BMI 24.14 kg/m    Gen: Elderly, somnolent male, laying in bed no distress, mumbles and arouses to verbal stimuli and then falls asleep HEENT: No JVD Lungs: A few scattered rhonchi, rest clear CVS: RRR,No Gallops,Rubs or new Murmurs Abd: soft, Non tender, non distended, BS present Extremities: No edema, right hip, dressing C/D/I Skin: no new rashes   CBC: Recent Labs  Lab 03/09/17 1038 03/10/17 0535 03/11/17 0435 03/12/17 0608 03/13/17 0333  WBC 6.4 15.6* 11.0* 6.9 8.9  NEUTROABS 5.0  --   --   --   --   HGB 11.2* 11.5* 8.1* 7.6* 8.3*  HCT 34.2* 34.5* 25.1* 23.4* 26.2*  MCV 94.7 94.0 94.4 95.5 94.9  PLT 184 174 145* 132* 193   Basic Metabolic Panel: Recent Labs  Lab 03/09/17 1038 03/10/17 0535 03/11/17 0435 03/12/17 0608  NA 142 143 142 145  K 3.4* 3.1* 3.6 3.5  CL 107 105 104 109  CO2 25 28 26 27   GLUCOSE 88 136* 163* 108*  BUN 19 24* 30* 26*  CREATININE 1.00 0.98 1.30* 0.98  CALCIUM 9.1 9.1 8.6* 8.5*  Coagulation Profile: Recent Labs  Lab 03/09/17 1214 03/10/17 0535  INR 0.99 1.14     LOS: 5 days   Time spent: 35 minutes.  Joe CovePreetha Myasia Sinatra, MD [facility] [email or web address] Password [email or web address] 03/14/2017, 2:21 PM

## 2017-03-14 NOTE — Progress Notes (Signed)
Physical Therapy Treatment Patient Details Name: Joe Mcpherson MRN: [id number] DOB: 05-28-1924 Today's Date: 03/14/2017    History of Present Illness Pt is a 84 y.o. male who presented to the ED after a mechanical fall and found to have a R femoral neck fracture. Now s/p R bipolar hip hemiarthroplasty. PMH significant for orthostatic hypotension, SVT, dementia, and hypertension.     PT Comments    Pt progressing slowly towards physical therapy goals. Pt limited by decreased tolerance for functional activity and overall lethargy this session. Focus of session initially around gown change and cleaning pt after condom cath was found removed in bed. Pt tolerated ~8 mins of EOB sitting activity before requiring return to supine. Continues to require SNF level rehab at d/c. Will continue to follow.    Follow Up Recommendations  SNF;Supervision/Assistance - 24 hour     Equipment Recommendations  None recommended by PT    Recommendations for Other Services       Precautions / Restrictions Precautions Precautions: Posterior Hip;Fall Precaution Booklet Issued: No Precaution Comments: Limited understanding of precautions due to cognitive deficits Restrictions Weight Bearing Restrictions: Yes RLE Weight Bearing: Weight bearing as tolerated    Mobility  Bed Mobility Overal bed mobility: Needs Assistance Bed Mobility: Supine to Sit;Sit to Supine;Rolling Rolling: Max assist   Supine to sit: Mod assist;+2 for physical assistance;HOB elevated Sit to supine: Min assist;+2 for physical assistance;HOB elevated   General bed mobility comments: VC's for sequencing. Hand-over-hand assist for reaching across to roll, and bed pad used to complete roll to L side. Assist required for LE off EOB and trunk rise to full sitting position. At end of session pt suddenly initiated sit>supine, and assist was required for LE elevation back into bed.   Transfers                    Ambulation/Gait                 Stairs            Wheelchair Mobility    Modified Rankin (Stroke Patients Only)       Balance Overall balance assessment: Needs assistance Sitting-balance support: No upper extremity supported;Feet supported Sitting balance-Leahy Scale: Fair Sitting balance - Comments: Assist initially for sitting balance but pt progressed to supervision level and occasional min guard. Sat EOB ~8 minutes.                                     Cognition Arousal/Alertness: Lethargic Behavior During Therapy: Restless Overall Cognitive Status: Impaired/Different from baseline Area of Impairment: Attention;Memory;Following commands;Safety/judgement;Awareness;Problem solving                   Current Attention Level: Focused Memory: Decreased recall of precautions;Decreased short-term memory Following Commands: Follows one step commands inconsistently Safety/Judgement: Decreased awareness of safety;Decreased awareness of deficits Awareness: Intellectual Problem Solving: Slow processing;Decreased initiation;Difficulty sequencing;Requires verbal cues;Requires tactile cues General Comments: Pt with mittens on and attempting to pull at gown. Condom catheter was off when PT arrived and pt had urine spilled on gown.       Exercises      General Comments General comments (skin integrity, edema, etc.): Noted several areas of blanchable redness on back. RN notified.      Pertinent Vitals/Pain Pain Assessment: Faces Faces Pain Scale: Hurts a little bit Pain Location: R hip Pain Descriptors / Indicators: Grimacing  Pain Intervention(s): Monitored during session    Home Living                      Prior Function            PT Goals (current goals can now be found in the care plan section) Acute Rehab PT Goals Patient Stated Goal: unable to state PT Goal Formulation: With patient Time For Goal Achievement: 03/18/17 Potential to Achieve Goals:  Good Progress towards PT goals: Progressing toward goals    Frequency    Min 3X/week      PT Plan Current plan remains appropriate    Co-evaluation              AM-PAC PT "6 Clicks" Daily Activity  Outcome Measure  Difficulty turning over in bed (including adjusting bedclothes, sheets and blankets)?: Unable Difficulty moving from lying on back to sitting on the side of the bed? : Unable Difficulty sitting down on and standing up from a chair with arms (e.g., wheelchair, bedside commode, etc,.)?: Unable Help needed moving to and from a bed to chair (including a wheelchair)?: A Lot Help needed walking in hospital room?: A Lot Help needed climbing 3-5 steps with a railing? : Total 6 Click Score: 8    End of Session Equipment Utilized During Treatment: Oxygen(Removed at beginning of session, sats 100% on RA) Activity Tolerance: Patient limited by lethargy;Patient limited by fatigue Patient left: in bed;with call bell/phone within reach;with bed alarm set;with restraints reapplied;with SCD's reapplied Nurse Communication: Mobility status;Precautions;Other (comment)(SCD's, skin integrity, condom cath, wet linen) PT Visit Diagnosis: Unsteadiness on feet (R26.81);Other abnormalities of gait and mobility (R26.89);Pain;Muscle weakness (generalized) (M62.81) Pain - Right/Left: Right Pain - part of body: Hip     Time: [id number] PT Time Calculation (min) (ACUTE ONLY): 25 min  Charges:  $Therapeutic Activity: 23-37 mins                    G Codes:       Conni SlipperLaura Aylinn Rydberg, PT, DPT Acute Rehabilitation Services Pager: [phone]    Marylynn PearsonLaura D Yobani Schertzer 03/14/2017, 3:08 PM

## 2017-03-14 NOTE — Progress Notes (Signed)
Sitter at bedside and discontinued Child psychotherapistAvys telesitter.

## 2017-03-14 NOTE — Social Work (Signed)
CSW discussed case with SNF. Pt still has sitter at bedside and is in mittens.  [facility] that patient will need at least 24hours without both prior to admission.  In addition, SNF has not received [employer] Auth for SNF placement.  CSW will f/u as patient is being transitioned from sitter and mittens.  CSW will f/u for disposition.  Keene BreathPatricia Nadra Hritz, LCSW Clinical Social Worker [phone]

## 2017-03-14 NOTE — Care Management Important Message (Signed)
Important Message  Patient Details  Name: Joe SignsRobert Mcpherson MRN: [id number] Date of Birth: 28-Oct-1924   Medicare Important Message Given:  Yes    Joe Mcpherson 03/14/2017, 11:51 AM

## 2017-03-15 LAB — CBC
HCT: 24.5 % — ABNORMAL LOW (ref 39.0–52.0)
Hemoglobin: 8 g/dL — ABNORMAL LOW (ref 13.0–17.0)
MCH: 30.5 pg (ref 26.0–34.0)
MCHC: 32.7 g/dL (ref 30.0–36.0)
MCV: 93.5 fL (ref 78.0–100.0)
PLATELETS: 236 10*3/uL (ref 150–400)
RBC: 2.62 MIL/uL — ABNORMAL LOW (ref 4.22–5.81)
RDW: 12.9 % (ref 11.5–15.5)
WBC: 8.7 10*3/uL (ref 4.0–10.5)

## 2017-03-15 LAB — BASIC METABOLIC PANEL
Anion gap: 11 (ref 5–15)
BUN: 23 mg/dL — AB (ref 6–20)
CALCIUM: 8.9 mg/dL (ref 8.9–10.3)
CO2: 28 mmol/L (ref 22–32)
CREATININE: 0.9 mg/dL (ref 0.61–1.24)
Chloride: 104 mmol/L (ref 101–111)
GFR calc Af Amer: >60 mL/min (ref >60)
GLUCOSE: 120 mg/dL — AB (ref 65–99)
Potassium: 3.2 mmol/L — ABNORMAL LOW (ref 3.5–5.1)
SODIUM: 143 mmol/L (ref 135–145)

## 2017-03-15 NOTE — Progress Notes (Signed)
PROGRESS NOTE  Joe Mcpherson  ZOX:[id number] DOB: 1924/07/16 DOA: 03/09/2017 PCP: Default, Provider, MD  Brief Narrative: Joe Mcpherson is a 84 y.o. male with a history of dementia, CAD s/p PCI, HTN, orthostatic hypotension on florinef who presented to the ED after a mechanical fall found to have a right femoral neck fracture. Orthopedics, Dr. Renaye Rakers, performed right hemiarthroplasty 2/15. Post-operative course has been complicated by acute delirium and acute blood loss anemia which has stabilized without need for transfusion. Therapy evaluations have recommended SNF at discharge which is being arranged. -Hospitalization complicated by agitation and delirium requiring Ativan resulting in somnolence  Assessment & Plan:  Right femoral neck fracture: -Status post mechanical fall -Ortho consulting, underwent hemiarthroplasty 2/15 by Dr. Eulah Pont.  - WBAT RLE, continue rehabilitation at [facility]. -Stopped narcotics due to oversedation -Lovenox for DVT prophylaxis  Acute delirium on chronic dementia: - Delirium precautions. - Stopped narcotics due to oversedation, held ativan , cut down remeron - more awake today but not alert enough yet --Recruitment consultant at bedside -Continue home dose of namenda -no s/s of infection, monitor for improvement off sedating meds  Dysphagia:  -In the background of dementia -SLP evaluated patient. No aspiration noted, though pt's cognitive status and postoperative condition/pain put at elevated risk. - Dysphagia 1 (puree) diet with thin liquids.  - Continue SLP at SNF  Leukocytosis: Suspect reactive to fall, fracture, resolved.   Acute blood loss anemia:  -Due to surgery 2/15, has stabilized without transfusion -Monitor   AKI: SCr up to 1.30 postop, returned to baseline 0.98.   - Monitor BMP in next week.  Orthostatic hypotension, dysautonomia:  - BPin high normal range, I have cut down his fludrocortisone dose to 0.1 mg daily  CAD s/p angioplasty with  stenting and h/o SVT:  - Continue ASA - not taking Cardizem as listed in med list  Hypokalemia: Resolved.  DVT prophylaxis: Lovenox Code Status: Full Family Communication: None at bedside today  Disposition Plan: SNF when mentation improved  Consultants: Ortho  Procedure Orthopedics, Dr. Renaye Rakers: ARTHROPLASTY BIPOLAR HIP (HEMIARTHROPLASTY) 03/10/2017  Antimicrobials: Ancef perioperatively   Subjective: -Remain somnolent, received 2 doses of IV Ativan overnight for agitation  Objective: BP 138/65 (BP Location: Right Arm)   Pulse 87   Temp 98.7 F (37.1 C) (Axillary)   Resp 16   Ht 6\' 2"  (1.88 m)   Wt 85.3 kg (188 lb)   SpO2 99%   BMI 24.14 kg/m    Gen: Sleeping when I walked in, easily arouses to verbal stimuli, able to answer a few questions, no distress, pleasant HEENT: PERRLA,  no JVD Lungs: Good air movement bilaterally,  few scattered rhonchi, rest clear CVS: RRR,No Gallops,Rubs or new Murmurs Abd: soft, Non tender, non distended, BS present Extremities:  right hip, dressing C/D/I Skin: no new rashes  Neuro: More awake today, moves all extremities, no localizing signs  CBC: Recent Labs  Lab 03/09/17 1038 03/10/17 0535 03/11/17 0435 03/12/17 0608 03/13/17 0333 03/15/17 0503  WBC 6.4 15.6* 11.0* 6.9 8.9 8.7  NEUTROABS 5.0  --   --   --   --   --   HGB 11.2* 11.5* 8.1* 7.6* 8.3* 8.0*  HCT 34.2* 34.5* 25.1* 23.4* 26.2* 24.5*  MCV 94.7 94.0 94.4 95.5 94.9 93.5  PLT 184 174 145* 132* 193 236   Basic Metabolic Panel: Recent Labs  Lab 03/09/17 1038 03/10/17 0535 03/11/17 0435 03/12/17 0608 03/15/17 0503  NA 142 143 142 145 143  K 3.4*  3.1* 3.6 3.5 3.2*  CL 107 105 104 109 104  CO2 25 28 26 27 28   GLUCOSE 88 136* 163* 108* 120*  BUN 19 24* 30* 26* 23*  CREATININE 1.00 0.98 1.30* 0.98 0.90  CALCIUM 9.1 9.1 8.6* 8.5* 8.9   Coagulation Profile: Recent Labs  Lab 03/09/17 1214 03/10/17 0535  INR 0.99 1.14     LOS: 6 days   Time spent: 35  minutes.  Zannie CovePreetha Renne Cornick, MD [facility] [email or web address] Password [facility] 03/15/2017, 2:04 PM

## 2017-03-15 NOTE — Plan of Care (Signed)
  Nutrition: Adequate nutrition will be maintained 03/15/2017 1203 - Progressing by Darrow BussingArcilla, Darrian Grzelak M, RN   Coping: Level of anxiety will decrease 03/15/2017 1203 - Progressing by Darrow BussingArcilla, Reah Justo M, RN   Pain Managment: General experience of comfort will improve 03/15/2017 1203 - Progressing by Darrow BussingArcilla, Kirston Luty M, RN   Safety: Ability to remain free from injury will improve 03/15/2017 1203 - Progressing by Darrow BussingArcilla, Arriah Wadle M, RN   Skin Integrity: Risk for impaired skin integrity will decrease 03/15/2017 1203 - Progressing by Darrow BussingArcilla, Aubryanna Nesheim M, RN

## 2017-03-15 NOTE — Progress Notes (Signed)
Physical Therapy Treatment Patient Details Name: Joe Mcpherson MRN: [id number] DOB: 10/17/1924 Today's Date: 03/15/2017    History of Present Illness Pt is a 84 y.o. male who presented to the ED after a mechanical fall and found to have a R femoral neck fracture. Now s/p R bipolar hip hemiarthroplasty. PMH significant for orthostatic hypotension, SVT, dementia, and hypertension.     PT Comments    Pt had a moment of decreased responsiveness with me EOB after standing in place at the side of the bed.  Positioned pt back in supine and he started to be more responsive with me.  I checked his vitals and they were stable (see details below).   RN made aware and PT to check orthostatic vitals next session as a precaution.   PT remains appropriate for SNF level therapy at d/c.  Follow Up Recommendations  SNF;Supervision/Assistance - 24 hour     Equipment Recommendations  None recommended by PT    Recommendations for Other Services   NA     Precautions / Restrictions Precautions Precautions: Posterior Hip;Fall;Other (comment) Precaution Comments: Limited understanding of precautions due to cognitive deficits, check orthostatics Restrictions RLE Weight Bearing: Weight bearing as tolerated    Mobility  Bed Mobility Overal bed mobility: Needs Assistance Bed Mobility: Supine to Sit;Sit to Supine     Supine to sit: Min assist;HOB elevated Sit to supine: Max assist   General bed mobility comments: Min assist to help pt progress legs to EOB and separately provide hand held assist to pull up to sitting EOB.  Pt had a pre syncopal even EOB which is why he was max assist to return to supine.    Transfers Overall transfer level: Needs assistance Equipment used: Rolling walker (2 wheeled) Transfers: Sit to/from Stand Sit to Stand: Min assist;From elevated surface         General transfer comment: Min assist to support trunk to stand EOB.  Pt stood for ~30 seconds and wanted to return to  sitting EOB where he was less responsive and PT had to position him manually back in supine.    Ambulation/Gait             General Gait Details: unable today.  Pt limited by lightheadedness.        Balance Overall balance assessment: Needs assistance Sitting-balance support: Feet supported;Bilateral upper extremity supported Sitting balance-Leahy Scale: Fair Sitting balance - Comments: close supervision EOB.    Standing balance support: Bilateral upper extremity supported Standing balance-Leahy Scale: Poor Standing balance comment: needs min assist for balance and support of RW in standing.                             Cognition Arousal/Alertness: Lethargic Behavior During Therapy: WFL for tasks assessed/performed Overall Cognitive Status: Impaired/Different from baseline Area of Impairment: Orientation;Attention;Memory;Following commands;Safety/judgement;Awareness;Problem solving                 Orientation Level: Disoriented to;Place;Time;Situation Current Attention Level: Sustained Memory: Decreased recall of precautions;Decreased short-term memory Following Commands: Follows one step commands consistently;Follows one step commands with increased time Safety/Judgement: Decreased awareness of safety;Decreased awareness of deficits Awareness: Intellectual Problem Solving: Decreased initiation;Difficulty sequencing;Requires verbal cues;Requires tactile cues General Comments: Pt with better processing today.  He has no concept of hip precautions as he had his legs crossed when I entered his room.        Exercises Total Joint Exercises Short Arc Quad: AROM;Right;Left;10 reps  Heel Slides: AAROM;Both;10 reps Hip ABduction/ADduction: AAROM;Both;10 reps    General Comments General comments (skin integrity, edema, etc.): Pt stood and sat back down becoming less responsive in sitting.  I positioned him back in supine and he started talking again.  VSS once  checked with BP 140s/70s, HR in the 90s and O2 sats mid 90s on RA.  RN made aware of event.       Pertinent Vitals/Pain Pain Assessment: Faces Pain Score: 0-No pain           PT Goals (current goals can now be found in the care plan section) Acute Rehab PT Goals Patient Stated Goal: unable to state Progress towards PT goals: Progressing toward goals    Frequency    Min 3X/week      PT Plan Current plan remains appropriate       AM-PAC PT "6 Clicks" Daily Activity  Outcome Measure  Difficulty turning over in bed (including adjusting bedclothes, sheets and blankets)?: Unable Difficulty moving from lying on back to sitting on the side of the bed? : Unable Difficulty sitting down on and standing up from a chair with arms (e.g., wheelchair, bedside commode, etc,.)?: Unable Help needed moving to and from a bed to chair (including a wheelchair)?: A Little Help needed walking in hospital room?: A Lot Help needed climbing 3-5 steps with a railing? : Total 6 Click Score: 9    End of Session Equipment Utilized During Treatment: Gait belt Activity Tolerance: Patient limited by fatigue Patient left: in bed;with call bell/phone within reach;with bed alarm set Nurse Communication: Other (comment)(pre syncopal events and VSS) PT Visit Diagnosis: Unsteadiness on feet (R26.81);Other abnormalities of gait and mobility (R26.89);Pain;Muscle weakness (generalized) (M62.81) Pain - Right/Left: Right Pain - part of body: Hip     Time: [id number] PT Time Calculation (min) (ACUTE ONLY): 19 min  Charges:  $Therapeutic Activity: 8-22 mins          Keileigh Vahey B. Vasco Chong, PT, DPT [phone]            03/15/2017, 3:28 PM

## 2017-03-15 NOTE — Social Work (Addendum)
CSW was advised by SNF that Joe Mcpherson needs to see patient PT notes from today.  CSW sent PT notes from yesterday.  PT will see patient again today.  CSW will continue to follow as SNF-[facility] f/u for Insurance auth for placement.  12:30pm: CSW spoke with family, Joe Mcpherson and advised of the status of the SNF  placement/process. [facility] received and when medically ready.  Keene BreathPatricia Bridger Pizzi, LCSW Clinical Social Worker [phone]

## 2017-03-16 DIAGNOSIS — W19XXXA Unspecified fall, initial encounter: Secondary | ICD-10-CM

## 2017-03-16 DIAGNOSIS — Z96649 Presence of unspecified artificial hip joint: Secondary | ICD-10-CM

## 2017-03-16 MED ORDER — SODIUM CHLORIDE 0.9 % IV SOLN: 12.5000 mg | Freq: Three times a day (TID) | INTRAVENOUS | Status: DC | PRN

## 2017-03-16 MED ORDER — ALUM & MAG HYDROXIDE-SIMETH 200-200-20 MG/5 ML NICU TOPICAL: 1.0000 "application " | Freq: Three times a day (TID) | TOPICAL | Status: DC | PRN

## 2017-03-16 MED ORDER — RESOURCE THICKENUP CLEAR PO POWD
ORAL | Status: DC | PRN
  Filled 2017-03-16: qty 125

## 2017-03-16 NOTE — Progress Notes (Signed)
Occupational Therapy Treatment Patient Details Name: Joe SignsRobert Bord MRN: [id number] DOB: [birth year] Today's Date: 03/16/2017    History of present illness Pt is a 84 y.o. male who presented to the ED after a mechanical fall and found to have a R femoral neck fracture. Now s/p R bipolar hip hemiarthroplasty. PMH significant for orthostatic hypotension, SVT, dementia, and hypertension.    OT comments  Pt participated in ADL retraining session with focus on self feeding/eating, grooming at bed level and repositioning in bed w/ Mod A. Cont to recommend SNF/24 hour assist at d/c.   Follow Up Recommendations  SNF;Supervision/Assistance - 24 hour    Equipment Recommendations  Other (comment)(TBD at next venue)    Recommendations for Other Services      Precautions / Restrictions Precautions Precautions: Posterior Hip;Fall;Other (comment) Precaution Booklet Issued: No Precaution Comments: Limited understanding of precautions due to cognitive deficits, check orthostatics Restrictions Weight Bearing Restrictions: Yes RLE Weight Bearing: Weight bearing as tolerated       Mobility Bed Mobility Overal bed mobility: Needs Assistance             General bed mobility comments: Pt was repositioned x2 in bed during session today for eating and then prior to ADL's. He was overall Mod-Max A for scooting up in bed, repositioning to midline for eating/sitting up in bed and then again prior to grooming tasks.  Transfers                      Balance                                           ADL either performed or assessed with clinical judgement   ADL Overall ADL's : Needs assistance/impaired Eating/Feeding: Set up;Bed level;Supervision/ safety   Grooming: Minimal assistance;Sitting;Wash/dry hands;Wash/dry face Grooming Details (indicate cue type and reason): Able to wash his face on command                               General ADL Comments: Pt  was seen for ADL retraining session today with focus on eating activity sitting up in bed as well as grooming and repositioning in bed. Pt was set-up/supervision in non-distracting environment to eat ice cream and drink a carton of milk.  Pt is Min A to reposition in bed prior to and after eating. He also performed grooming tasks to wash his hands and face with set up and verbal commands. Pt demonstrated facial grimmace several times during this session and was asked if he was hurting anywhere. He stated that his hip hurt, RN was called and made aware of this.     Vision   Vision Assessment?: No apparent visual deficits   Perception     Praxis      Cognition Arousal/Alertness: Awake/alert Behavior During Therapy: WFL for tasks assessed/performed Overall Cognitive Status: Impaired/Different from baseline Area of Impairment: Orientation;Attention;Memory;Safety/judgement;Awareness;Problem solving                 Orientation Level: Disoriented to;Place;Time;Situation Current Attention Level: Sustained Memory: Decreased recall of precautions;Decreased short-term memory Following Commands: Follows one step commands consistently;Follows one step commands with increased time Safety/Judgement: Decreased awareness of safety;Decreased awareness of deficits Awareness: Intellectual Problem Solving: Decreased initiation;Difficulty sequencing;Requires verbal cues;Requires tactile cues General Comments: Pt cont to exhibit unawareness of  hip precautions as he had his legs crossed when OT entered his room.          Exercises     Shoulder Instructions       General Comments      Pertinent Vitals/ Pain       Pain Assessment: Faces Faces Pain Scale: Hurts a little bit Pain Location: R hip Pain Descriptors / Indicators: Grimacing Pain Intervention(s): Limited activity within patient's tolerance;Repositioned;Monitored during session;Other (comment)(RN notified and will check to see if pt can  have tylenol)  Home Living                                          Prior Functioning/Environment              Frequency  Min 2X/week        Progress Toward Goals  OT Goals(current goals can now be found in the care plan section)  Progress towards OT goals: Progressing toward goals  Acute Rehab OT Goals Patient Stated Goal: Pt is unable to state  Plan Discharge plan remains appropriate    Co-evaluation                 AM-PAC PT "6 Clicks" Daily Activity     Outcome Measure   Help from another person eating meals?: A Little Help from another person taking care of personal grooming?: A Little Help from another person toileting, which includes using toliet, bedpan, or urinal?: Total Help from another person bathing (including washing, rinsing, drying)?: A Lot Help from another person to put on and taking off regular upper body clothing?: A Lot Help from another person to put on and taking off regular lower body clothing?: Total 6 Click Score: 12    End of Session    OT Visit Diagnosis: Other abnormalities of gait and mobility (R26.89);Other symptoms and Mcpherson involving cognitive function;Pain Pain - Right/Left: Right Pain - part of body: Hip   Activity Tolerance Patient tolerated treatment well   Patient Left in bed;with call bell/phone within reach;with bed alarm set   Nurse Communication Other (comment)(Pt stating that his hip hurt ?? need for pain meds)        Time: 1107-1130 OT Time Calculation (min): 23 min  Charges: OT General Charges $OT Visit: 1 Visit OT Treatments $Self Care/Home Management : 23-37 mins    Abena Erdman Beth Dixon, OTR/L 03/16/2017, 11:56 AM

## 2017-03-16 NOTE — Progress Notes (Signed)
  Speech Language Pathology Treatment: Dysphagia  Patient Details Name: Joe Mcpherson MRN: [id number] DOB: March 12, 1924 Today's Date: 03/16/2017 Time: [id number] SLP Time Calculation (min) (ACUTE ONLY): 16 min  Assessment / Plan / Recommendation Clinical Impression  Pt alert and upright, pleasant and participatory, but observed to have consistent, immediate coughing with sips of water. Swallow response subjectively appears delayed. Pt was able to consume nectar thick liquids from a straw with no subsequent coughing. Pt also able to self feed and appropriately masticate a graham cracker. Recommend downgrading pts liquids to nectar to reduce risk of aspiration, but upgrade solids to ground (dys 2) solids to improve pt acceptance of meals. Will plan on MBS tomorrow am prior to potential d/c for objective assessment of dysphagia to guide therapy plan at next level of care.   HPI HPI: Pt is a 84 y.o. male PMH oforthostatic hypotension on aldosterone antagonist, coronary artery disease status post stenting, SVT, dementia, hypertension who comes in after having a mechanical fall onto his right hip. Post-operative course complicated by acute delirium. CXR showed "COPD. Slightly increased right upper lobe lung markings as compared to the previous study may reflect atelectasis or early pneumonia." Also with leukocytosis which is suspected to b e reactive to fall/ fracture.      SLP Plan  MBS       Recommendations  Diet recommendations: Dysphagia 3 (mechanical soft);Nectar-thick liquid Liquids provided via: Cup;Straw Medication Administration: Crushed with puree Supervision: Staff to assist with self feeding Compensations: Slow rate;Small sips/bites Postural Changes and/or Swallow Maneuvers: Seated upright 90 degrees                Oral Care Recommendations: Oral care BID Follow up Recommendations: Skilled Nursing facility SLP Visit Diagnosis: Dysphagia, unspecified (R13.10) Plan: MBS        GO               Joe DittyBonnie Avant Printy, MA CCC-SLP [phone]  Joe Mcpherson, Joe Mcpherson 03/16/2017, 12:32 PM

## 2017-03-16 NOTE — Progress Notes (Signed)
PROGRESS NOTE  Joe Mcpherson  UJW:[id number] DOB: January 24, 1925 DOA: 03/09/2017 PCP: Default, Provider, MD  Brief Narrative: Joe Mcpherson is a 84 y.o. male with a history of dementia, CAD s/p PCI, HTN, orthostatic hypotension on florinef who presented to the ED after a mechanical fall found to have a right femoral neck fracture. Orthopedics, Dr. Renaye Rakers, performed right hemiarthroplasty 2/15. Post-operative course has been complicated by acute delirium and acute blood loss anemia which has stabilized without need for transfusion. Therapy evaluations have recommended SNF at discharge which is being arranged. -Hospitalization complicated by agitation and delirium requiring Ativan resulting in somnolence  Assessment & Plan:  Right femoral neck fracture: -Status post mechanical fall -Ortho consulting, underwent hemiarthroplasty 2/15 by Dr. Eulah Pont.  -WBAT RLE, continue rehabilitation at SNF. -Stopped narcotics due to oversedation -Lovenox for DVT prophylaxis -mentation improving, discharge planning  Acute delirium on chronic dementia: -Continue delirium precautions, I had stopped narcotics and Ativan 48 hours ago due to oversedation  -Remeron dose cut down -No signs or symptoms of infection -Mentation improving off sedating medications, DC sitter  -discharge planning  Dysphagia:  -In the background of dementia -SLP evaluated patient. No aspiration noted, though pt's cognitive status and postoperative condition/pain put at elevated risk. - Dysphagia 1 (puree) diet with thin liquids.  - Continue SLP at SNF  Leukocytosis: Suspect reactive to fall, fracture, resolved.   Acute blood loss anemia:  -Due to surgery 2/15, has stabilized without transfusion -Monitor   AKI: SCr up to 1.30 postop, returned to baseline 0.98.   - Monitor BMP in next week.  Orthostatic hypotension, dysautonomia:  - BPin high normal range, I have cut down his fludrocortisone dose to 0.1 mg daily  CAD s/p  angioplasty with stenting and h/o SVT:  - Continue ASA - not taking Cardizem as listed in med list  Hypokalemia: Resolved.  DVT prophylaxis: Lovenox Code Status: Full Family Communication: None at bedside today  Disposition Plan: SNF  tomorrow if stable  Consultants: Ortho  Procedure Orthopedics, Dr. Renaye Rakers: ARTHROPLASTY BIPOLAR HIP (HEMIARTHROPLASTY) 03/10/2017  Antimicrobials: Ancef perioperatively   Subjective: -Remain somnolent, received 2 doses of IV Ativan overnight for agitation  Objective: BP (!) 153/69 (BP Location: Left Arm)   Pulse 79   Temp 98 F (36.7 C) (Oral)   Resp 16   Ht 6\' 2"  (1.88 m)   Wt 85.3 kg (188 lb)   SpO2 100%   BMI 24.14 kg/m    Gen: More alert today, lucid, answers most of my questions, always oriented to self and partially to place HEENT: PERRLA, Neck supple, no JVD Lungs: Few scattered rhonchi CVS: S1-S2/regular rate rhythm Abd: soft, Non tender, non distended, BS present Extremities: No edema, right hip -incision healing well Skin: no new rashes  CBC: Recent Labs  Lab 03/10/17 0535 03/11/17 0435 03/12/17 0608 03/13/17 0333 03/15/17 0503  WBC 15.6* 11.0* 6.9 8.9 8.7  HGB 11.5* 8.1* 7.6* 8.3* 8.0*  HCT 34.5* 25.1* 23.4* 26.2* 24.5*  MCV 94.0 94.4 95.5 94.9 93.5  PLT 174 145* 132* 193 236   Basic Metabolic Panel: Recent Labs  Lab 03/10/17 0535 03/11/17 0435 03/12/17 0608 03/15/17 0503  NA 143 142 145 143  K 3.1* 3.6 3.5 3.2*  CL 105 104 109 104  CO2 28 26 27 28   GLUCOSE 136* 163* 108* 120*  BUN 24* 30* 26* 23*  CREATININE 0.98 1.30* 0.98 0.90  CALCIUM 9.1 8.6* 8.5* 8.9   Coagulation Profile: Recent Labs  Lab 03/10/17 0535  INR 1.14     LOS: 7 days   Time spent: 25 minutes.  Zannie CovePreetha Carsyn Taubman, MD [facility] [email or web address] Password [facility] 03/16/2017, 2:18 PM

## 2017-03-16 NOTE — Progress Notes (Signed)
Pt had no concerns throughout the night. Tele sitter discontinued.

## 2017-03-16 NOTE — Progress Notes (Addendum)
Pt noted to be scratching at area on right side of lower abdomen and area was bleeding slightly. Area cleansed.  Pt also has bleeding coming from meatus of penis. Dr made aware and stated to monitor. Condom cath applied to monitor urine output.

## 2017-03-17 ENCOUNTER — Inpatient Hospital Stay (HOSPITAL_COMMUNITY): Payer: Medicare HMO

## 2017-03-17 LAB — CBC
HCT: 23 % — ABNORMAL LOW (ref 39.0–52.0)
Hemoglobin: 7.3 g/dL — ABNORMAL LOW (ref 13.0–17.0)
MCH: 30.5 pg (ref 26.0–34.0)
MCHC: 31.7 g/dL (ref 30.0–36.0)
MCV: 96.2 fL (ref 78.0–100.0)
Platelets: 270 10*3/uL (ref 150–400)
RBC: 2.39 MIL/uL — AB (ref 4.22–5.81)
RDW: 13.2 % (ref 11.5–15.5)
WBC: 6.9 10*3/uL (ref 4.0–10.5)

## 2017-03-17 LAB — BASIC METABOLIC PANEL
Anion gap: 9 (ref 5–15)
BUN: 29 mg/dL — AB (ref 6–20)
CALCIUM: 8.5 mg/dL — AB (ref 8.9–10.3)
CO2: 29 mmol/L (ref 22–32)
Chloride: 108 mmol/L (ref 101–111)
Creatinine, Ser: 0.97 mg/dL (ref 0.61–1.24)
GFR calc Af Amer: >60 mL/min (ref >60)
GFR calc non Af Amer: >60 mL/min (ref >60)
GLUCOSE: 128 mg/dL — AB (ref 65–99)
POTASSIUM: 3.2 mmol/L — AB (ref 3.5–5.1)
SODIUM: 146 mmol/L — AB (ref 135–145)

## 2017-03-17 MED ORDER — FLUDROCORTISONE ACETATE 0.1 MG PO TABS
0.2000 mg | ORAL_TABLET | Freq: Every day | ORAL | Status: DC
  Administered 2017-03-17 – 2017-03-20 (×4): 0.2 mg via ORAL
  Filled 2017-03-17 (×4): qty 2

## 2017-03-17 MED ORDER — SODIUM CHLORIDE 0.9 % IV SOLN
INTRAVENOUS | Status: AC
Start: 2017-03-17 — End: 2017-03-17
  Administered 2017-03-17: 75 mL/h via INTRAVENOUS

## 2017-03-17 MED ORDER — SODIUM CHLORIDE 0.9 % IV SOLN: INTRAVENOUS | Status: DC

## 2017-03-17 MED ORDER — FLUDROCORTISONE ACETATE 0.1 MG PO TABS: 0.1000 mg | ORAL_TABLET | Freq: Once | ORAL | Status: DC

## 2017-03-17 NOTE — Social Work (Addendum)
CSW f/u with SNF as [place] [employer] has not authorized SNF placement yet. [employer] needs to some progress with therapy. Therapy needs to keep working with patient.   [facility] Auth for SNF placement as CSW sent clinical notes again from today.  CSW left message requesting a call back.  CSW will continue to follow and assist as placement has not been authorized by SNF.  Keene BreathPatricia Mitsuye Schrodt, LCSW Clinical Social Worker [phone]

## 2017-03-17 NOTE — Progress Notes (Signed)
Physical Therapy Treatment Patient Details Name: Joe Mcpherson MRN: [id number] DOB: 1924-04-14 Today's Date: 03/17/2017    History of Present Illness Pt is a 84 y.o. male who presented to the ED after a mechanical fall and found to have a R femoral neck fracture. Now s/p R bipolar hip hemiarthroplasty. PMH significant for orthostatic hypotension, SVT, dementia, and hypertension.     PT Comments    Patient received in bed, easily woken and willing to participate in PT today. Supine BP 117/72, and patient requires MaxA for functional supine to sit; note sitting BP initially 63/38, rose to 72/50 sitting x3 minutes. Patient asymptomatic but PT unable to safely progress mobility today due to concern over orthostatics. He was returned to bed with maxA x2 and repositioned in bed with HOB elevated within hip precautions. Unable to determine PLOF today as patient is poor historian, unable to progress mobility or attempt ambulation due to safety concerns over orthostatic readings. He was left in bed with all needs met, bed alarm activated. Continue to recommend SNF.     Follow Up Recommendations  SNF;Supervision/Assistance - 24 hour     Equipment Recommendations  None recommended by PT    Recommendations for Other Services       Precautions / Restrictions Precautions Precautions: Posterior Hip;Fall;Other (comment) Precaution Booklet Issued: No Precaution Comments: Limited understanding of precautions due to cognitive deficits, check orthostatics Restrictions Weight Bearing Restrictions: Yes RLE Weight Bearing: Weight bearing as tolerated    Mobility  Bed Mobility Overal bed mobility: Needs Assistance Bed Mobility: Supine to Sit;Sit to Supine Rolling: Mod assist   Supine to sit: Max assist;+2 for physical assistance Sit to supine: Max assist;+2 for physical assistance   General bed mobility comments: MaxAx2 today due to poor patient pariticpation, assist also to maintain hip precautions.  Significant orthostatic drop in sitting, unable to attempt transfers or gait today   Transfers                 General transfer comment: DNT due to orthostatics   Ambulation/Gait             General Gait Details: DNT due to orthostatics    Stairs            Wheelchair Mobility    Modified Rankin (Stroke Patients Only)       Balance Overall balance assessment: Needs assistance Sitting-balance support: Feet supported;Bilateral upper extremity supported Sitting balance-Leahy Scale: Fair Sitting balance - Comments: close supervision EOB.    Standing balance support: Bilateral upper extremity supported Standing balance-Leahy Scale: Poor Standing balance comment: needs min assist for balance and support of RW in standing.                             Cognition Arousal/Alertness: Awake/alert Behavior During Therapy: WFL for tasks assessed/performed Overall Cognitive Status: Impaired/Different from baseline Area of Impairment: Orientation;Attention;Memory;Safety/judgement;Awareness;Problem solving                 Orientation Level: Disoriented to;Place;Time;Situation Current Attention Level: Sustained Memory: Decreased recall of precautions;Decreased short-term memory Following Commands: Follows one step commands consistently;Follows one step commands with increased time Safety/Judgement: Decreased awareness of safety;Decreased awareness of deficits Awareness: Intellectual Problem Solving: Decreased initiation;Difficulty sequencing;Requires verbal cues;Requires tactile cues General Comments: poor awareness of hip precautions and safety       Exercises      General Comments General comments (skin integrity, edema, etc.): BP in supine 117/72, sitting  63/38, increased to 72/50 sitting x3 minutes. DNT further mobility due to orthostatic limitations and safety concerns today.       Pertinent Vitals/Pain Pain Assessment: Faces Pain Score:  0-No pain Faces Pain Scale: No hurt Pain Intervention(s): Limited activity within patient's tolerance;Monitored during session    Home Living                      Prior Function            PT Goals (current goals can now be found in the care plan section) Acute Rehab PT Goals Patient Stated Goal: Pt is unable to state PT Goal Formulation: With patient Time For Goal Achievement: 03/18/17 Potential to Achieve Goals: Good Progress towards PT goals: Progressing toward goals    Frequency    Min 3X/week      PT Plan Current plan remains appropriate    Co-evaluation              AM-PAC PT "6 Clicks" Daily Activity  Outcome Measure  Difficulty turning over in bed (including adjusting bedclothes, sheets and blankets)?: Unable Difficulty moving from lying on back to sitting on the side of the bed? : Unable Difficulty sitting down on and standing up from a chair with arms (e.g., wheelchair, bedside commode, etc,.)?: Unable Help needed moving to and from a bed to chair (including a wheelchair)?: A Little Help needed walking in hospital room?: A Lot Help needed climbing 3-5 steps with a railing? : Total 6 Click Score: 9    End of Session   Activity Tolerance: Patient tolerated treatment well Patient left: in bed;with call bell/phone within reach;with bed alarm set(HOB elevated within safe hip precautions ) Nurse Communication: Other (comment)(relayed orthostatics to nursing student ) PT Visit Diagnosis: Unsteadiness on feet (R26.81);Other abnormalities of gait and mobility (R26.89);Pain;Muscle weakness (generalized) (M62.81) Pain - Right/Left: Right Pain - part of body: Hip     Time: 1013-1030 PT Time Calculation (min) (ACUTE ONLY): 17 min  Charges:                       G Codes:       Deniece Ree PT, DPT, CBIS  Supplemental Physical Therapist [place]   Pager [phone]

## 2017-03-17 NOTE — Progress Notes (Signed)
Modified Barium Swallow Progress Note  Patient Details  Name: Joe Mcpherson MRN: [id number] Date of Birth: 01-25-1924  Today's Date: 03/17/2017  Modified Barium Swallow completed.  Full report located under Chart Review in the Imaging Section.  Brief recommendations include the following:  Clinical Impression  Pt presents with mild to moderate weakness and slowed timing across the pharyngeal swallowing mechanism. Swallowing initiation with normal sized boluses occurrs at pyriforms sinus, there is moderate vallecular and pyriform residual present post swallow due to presumed mild weakness. if residuals are not cleared fully, subsequent swallows often result in silent penetration/aspiration of residue and head of next bolus. A chin tuck was minimally beneficial in reducing residual, but most important technique to minimize aspiration events was limiting bolus size with nectar thick liquids to prevent spill to pyriforms.   Regardless, moderate residuals across consistencies will likely lead to aspiration events throughout PO intake. Will continue a nectar thick liquid diet with soft solids, though risk of aspiration from deconditioning following hip fracture is quite high for this pt. Pt should f/u with SLP at next level of care for pharyngeal strengthening, respiratory muscle strength training and aspiration precautions. Will follow while admitted acutely to initiate therapeutic interventions.    Swallow Evaluation Recommendations       SLP Diet Recommendations: Dysphagia 2 (Fine chop) solids;Nectar thick liquid   Liquid Administration via: Cup;Straw   Medication Administration: Crushed with puree   Supervision: Staff to assist with self feeding   Compensations: Slow rate;Small sips/bites   Postural Changes: Seated upright at 90 degrees;Remain semi-upright after after feeds/meals (Comment)   Oral Care Recommendations: Oral care BID   Other Recommendations: Order thickener from  pharmacy;Have oral suction available   Harlon DittyBonnie Tedd Cottrill, MA CCC-SLP [phone]  Joe Mcpherson, Joe Mcpherson 03/17/2017,10:32 AM

## 2017-03-17 NOTE — Social Work (Addendum)
CSW was advised [facility] as they indicated that therapy notes need to address prior level functioning and how far he is able to ambulate.  CSW f/u with clinical staff to assist. CSW sent clinicals notes from yesterday to SNF.  Still no [employer] Auth for SNF placement.  Keene BreathPatricia Saryna Kneeland, LCSW Clinical Social Worker [phone]

## 2017-03-17 NOTE — Social Work (Signed)
[facility] and discussed patient disposition with Shawna, [phone]. CSW discussed the [facility] and the barriers. ALF indicated that they cannot take patient back until he has short term rehab as they cannot assist with his limitation/physical impairment. Artist PaisShawna indicated that they were intending on transitioning him into memory care, however he had the fall and they had to halt the process. She indicated that they will f/u for next steps in the care process, and they are adamant that they will not be able to take patient back if [facility].  Artist PaisShawna indicated she will discuss with her administrators.  CSW will continue to follow for SNF placement and with SN[facility].  CSW will continue to follow.  Keene BreathPatricia Sharnese Heath, LCSW Clinical Social Worker [phone]

## 2017-03-17 NOTE — Progress Notes (Signed)
PROGRESS NOTE  Joe Mcpherson  ZOX:[id number] DOB: [birth year] DOA: 03/09/2017 PCP: Default, Provider, MD  Brief Narrative: Joe Mcpherson is a 84 y.o. male with a history of dementia, CAD s/p PCI, HTN, orthostatic hypotension on florinef who presented to the ED after a mechanical fall found to have a right femoral neck fracture. Orthopedics, Dr. Renaye Rakers, performed right hemiarthroplasty 2/15. Post-operative course has been complicated by acute delirium and acute blood loss anemia which has stabilized without need for transfusion. Therapy evaluations have recommended SNF at discharge which is being arranged. -Hospitalization complicated by agitation and delirium requiring Ativan resulting in somnolence -mentation slowly improving and less agitated now  Assessment & Plan:  Right femoral neck fracture: -Status post mechanical fall -Ortho consulting, underwent hemiarthroplasty 2/15 by Dr. Eulah Pont.  -WBAT RLE, continue rehabilitation at SNF. -Stopped narcotics due to oversedation -Lovenox for DVT prophylaxis -mentation more stable, discharge planning  Acute delirium on chronic dementia: -Continue delirium precautions, I had stopped narcotics and Ativan 72 hours ago due to oversedation  -Remeron dose cut down -No signs or symptoms of infection -Mentation improving off sedating medications, off sitter  -discharge planning  Dysphagia:  - In the background of dementia - SLP following - Dysphagia 2  With thickened liquids.  - Continue SLP at SNF  Leukocytosis: Suspect reactive to fall, fracture, resolved.   Acute blood loss anemia:  -Due to surgery 2/15, has stabilized without transfusion -Monitor   AKI: SCr up to 1.30 postop, returned to baseline 0.98.   - Monitor BMP in next week.  Orthostatic hypotension, dysautonomia:  - BP soft and + significant Orthostasis this am without symptoms - IVF for 6h, increase Florinef dose back to 0.2mg   CAD s/p angioplasty with stenting and h/o SVT:    - Continue ASA - not taking Cardizem as listed in med list  Hypokalemia: Resolved.  DVT prophylaxis: Lovenox Code Status: Full Family Communication: None at bedside today  Disposition Plan: SNF when Bed Available  Consultants: Ortho  Procedure Orthopedics, Dr. Renaye Rakers: ARTHROPLASTY BIPOLAR HIP (HEMIARTHROPLASTY) 03/10/2017  Antimicrobials: Ancef perioperatively   Subjective: -feels ok, no signifi agitation overnight  Objective: BP (!) 122/53 (BP Location: Left Arm)   Pulse 73   Temp 98.8 F (37.1 C) (Oral)   Resp 17   Ht 6\' 2"  (1.88 m)   Wt 85.3 kg (188 lb)   SpO2 98%   BMI 24.14 kg/m    Gen: Awake, Alert, Oriented X 2, more lucid this am HEENT: PERRLA, Neck supple, no JVD Lungs: Good air movement bilaterally, CTAB CVS: S1S2/RRR Abd: soft, Non tender, non distended, BS present Extremities: No Cyanosis, Clubbing or edema Skin: no new rashes  CBC: Recent Labs  Lab 03/11/17 0435 03/12/17 0608 03/13/17 0333 03/15/17 0503 03/17/17 1151  WBC 11.0* 6.9 8.9 8.7 6.9  HGB 8.1* 7.6* 8.3* 8.0* 7.3*  HCT 25.1* 23.4* 26.2* 24.5* 23.0*  MCV 94.4 95.5 94.9 93.5 96.2  PLT 145* 132* 193 236 270   Basic Metabolic Panel: Recent Labs  Lab 03/11/17 0435 03/12/17 0608 03/15/17 0503 03/17/17 1151  NA 142 145 143 146*  K 3.6 3.5 3.2* 3.2*  CL 104 109 104 108  CO2 26 27 28 29   GLUCOSE 163* 108* 120* 128*  BUN 30* 26* 23* 29*  CREATININE 1.30* 0.98 0.90 0.97  CALCIUM 8.6* 8.5* 8.9 8.5*   Coagulation Profile: No results for input(s): INR, PROTIME in the last 168 hours.   LOS: 8 days   Time spent: 25 minutes.  Zannie CovePreetha Melaney Tellefsen, MD [facility] [email or web address] Password [email or web address] 03/17/2017, 1:23 PM

## 2017-03-18 LAB — ABO/RH: ABO/RH(D): B POS

## 2017-03-18 LAB — PREPARE RBC (CROSSMATCH)

## 2017-03-18 MED ORDER — MORPHINE SULFATE (PF) 2 MG/ML IV SOLN
1.0000 mg | INTRAVENOUS | Status: DC | PRN
  Administered 2017-03-18 – 2017-03-19 (×4): 1 mg via INTRAVENOUS
  Filled 2017-03-18 (×5): qty 1

## 2017-03-18 MED ORDER — SODIUM CHLORIDE 0.9 % IV SOLN: Freq: Once | INTRAVENOUS | Status: DC

## 2017-03-18 NOTE — Progress Notes (Signed)
PROGRESS NOTE  Joe Mcpherson  ZOX:[id number] DOB: 07/01/1924 DOA: 03/09/2017 PCP: Default, Provider, MD  Brief Narrative: Joe Mcpherson is a 84 y.o. male with a history of dementia, CAD s/p PCI, HTN, orthostatic hypotension on florinef who presented to the ED after a mechanical fall found to have a right femoral neck fracture. Orthopedics, Dr. Renaye Rakersim Murphy, performed right hemiarthroplasty 2/15. Post-operative course has been complicated by acute delirium and acute blood loss anemia which has stabilized without need for transfusion. Therapy evaluations have recommended SNF at discharge which is being arranged. -Hospitalization complicated by agitation and delirium requiring Ativan resulting in somnolence -mentation slowly improving and less agitated now  Assessment & Plan:  Right femoral neck fracture: -Status post mechanical fall -Ortho consulting, underwent hemiarthroplasty 2/15 by Dr. Eulah PontMurphy.  -WBAT RLE, continue rehabilitation at SNF. -Stopped narcotics due to oversedation, restart morphine today due to severe pain -Lovenox for DVT prophylaxis -mentation more stable, discharge planning  Acute delirium on chronic dementia: -Continue delirium precautions, I had stopped narcotics and Ativan 72 hours ago due to oversedation  -Remeron dose cut down -No signs or symptoms of infection -Mentation improving off sedating medications, off sitter  -discharge planning  Dysphagia:  - In the background of dementia - SLP following - Dysphagia 2  With thickened liquids.  - Continue SLP at SNF  Leukocytosis: Suspect reactive to fall, fracture, resolved.   Acute blood loss anemia:  -Due to surgery 2/15,  -hb has drifted down to 7.5 today, likely a dilutional component as well Will give 1 unit of PRBC today - AKI: SCr up to 1.30 postop, returned to baseline 0.98.   -Stable  Orthostatic hypotension, dysautonomia:  - BP soft and + significant Orthostasis yesterday without any symptoms -Given IV  fluids for 8 hours and increased Florinef dose back to 0.2 mg  CAD s/p angioplasty with stenting and h/o SVT:  - Continue ASA - not taking Cardizem as listed in med list  Hypokalemia: Resolved.  DVT prophylaxis: Lovenox Code Status: Full Family Communication: None at bedside today  Disposition Plan: SNF when Bed Available  Consultants: Ortho  Procedure Orthopedics, Dr. Renaye Rakersim Murphy: ARTHROPLASTY BIPOLAR HIP (HEMIARTHROPLASTY) 03/10/2017  Antimicrobials: Ancef perioperatively   Subjective: -Having left hip/high pain and discomfort -Mentation remains more stable since overnight  Objective: BP 139/66 (BP Location: Left Arm)   Pulse 81   Temp 98 F (36.7 C) (Oral)   Resp 17   Ht 6\' 2"  (1.88 m)   Wt 85.3 kg (188 lb)   SpO2 98%   BMI 24.14 kg/m    Gen: Alert awake oriented to self and place, uncomfortable in pain HEENT: PERRLA, Neck supple, no JVD Lungs: Good air movement bilaterally, CTAB CVS: RRR,No Gallops,Rubs or new Murmurs Abd: soft, Non tender, non distended, BS present Extremities: No edema, right hip with incision healing well Skin: no new rashes  CBC: Recent Labs  Lab 03/12/17 0608 03/13/17 0333 03/15/17 0503 03/17/17 1151  WBC 6.9 8.9 8.7 6.9  HGB 7.6* 8.3* 8.0* 7.3*  HCT 23.4* 26.2* 24.5* 23.0*  MCV 95.5 94.9 93.5 96.2  PLT 132* 193 236 270   Basic Metabolic Panel: Recent Labs  Lab 03/12/17 0608 03/15/17 0503 03/17/17 1151  NA 145 143 146*  K 3.5 3.2* 3.2*  CL 109 104 108  CO2 27 28 29   GLUCOSE 108* 120* 128*  BUN 26* 23* 29*  CREATININE 0.98 0.90 0.97  CALCIUM 8.5* 8.9 8.5*   Coagulation Profile: No results for input(s): INR, PROTIME  in the last 168 hours.   LOS: 9 days   Time spent: 25 minutes.  Joe Cove, MD [facility] [email or web address] Password [facility] 03/18/2017, 12:53 PM

## 2017-03-19 LAB — BASIC METABOLIC PANEL
ANION GAP: 9 (ref 5–15)
BUN: 25 mg/dL — ABNORMAL HIGH (ref 6–20)
CALCIUM: 8.4 mg/dL — AB (ref 8.9–10.3)
CO2: 28 mmol/L (ref 22–32)
Chloride: 110 mmol/L (ref 101–111)
Creatinine, Ser: 0.85 mg/dL (ref 0.61–1.24)
GLUCOSE: 110 mg/dL — AB (ref 65–99)
POTASSIUM: 3.4 mmol/L — AB (ref 3.5–5.1)
Sodium: 147 mmol/L — ABNORMAL HIGH (ref 135–145)

## 2017-03-19 LAB — BPAM RBC
BLOOD PRODUCT EXPIRATION DATE: 201903192359
ISSUE DATE / TIME: 201902231657
Unit Type and Rh: 7300

## 2017-03-19 LAB — CBC
HEMATOCRIT: 25.1 % — AB (ref 39.0–52.0)
Hemoglobin: 7.9 g/dL — ABNORMAL LOW (ref 13.0–17.0)
MCH: 30 pg (ref 26.0–34.0)
MCHC: 31.5 g/dL (ref 30.0–36.0)
MCV: 95.4 fL (ref 78.0–100.0)
PLATELETS: 295 10*3/uL (ref 150–400)
RBC: 2.63 MIL/uL — AB (ref 4.22–5.81)
RDW: 14.6 % (ref 11.5–15.5)
WBC: 7.5 10*3/uL (ref 4.0–10.5)

## 2017-03-19 LAB — TYPE AND SCREEN
ABO/RH(D): B POS
Antibody Screen: NEGATIVE
Unit division: 0

## 2017-03-19 MED ORDER — POTASSIUM CHLORIDE CRYS ER 20 MEQ PO TBCR
40.0000 meq | EXTENDED_RELEASE_TABLET | Freq: Once | ORAL | Status: AC
  Administered 2017-03-19: 40 meq via ORAL
  Filled 2017-03-19: qty 2

## 2017-03-19 MED ORDER — DEXTROSE 5 % IV SOLN
INTRAVENOUS | Status: AC
  Administered 2017-03-19: 13:00:00 via INTRAVENOUS

## 2017-03-19 MED ORDER — DEXTROSE 5 % IV SOLN: INTRAVENOUS | Status: DC

## 2017-03-19 NOTE — Progress Notes (Signed)
PROGRESS NOTE  Joe Mcpherson  XWR:[id number] DOB: 12-30-1924 DOA: 03/09/2017 PCP: Default, Provider, MD  Brief Narrative: Joe Mcpherson is a 84 y.o. male with a history of dementia, CAD s/p PCI, HTN, orthostatic hypotension on florinef who presented to the ED after a mechanical fall found to have a right femoral neck fracture. Orthopedics, Dr. Renaye Rakers, performed right hemiarthroplasty 2/15. Post-operative course has been complicated by acute delirium and acute blood loss anemia which has stabilized without need for transfusion. Therapy evaluations have recommended SNF at discharge which is being arranged. -Hospitalization complicated by agitation and delirium requiring Ativan resulting in somnolence -mentation slowly improving and less agitated now  Assessment & Plan:  Right femoral neck fracture: -Status post mechanical fall -Ortho consulting, underwent hemiarthroplasty 2/15 by Dr. Eulah Pont.  -WBAT RLE, continue rehabilitation at SNF. -morphine PRN for severe pain -Lovenox for DVT prophylaxis -mentation more stable, discharge planning  Acute delirium on chronic dementia: -Continue delirium precautions, I had stopped narcotics and Ativan 72 hours ago due to oversedation  -Remeron dose cut down -No signs or symptoms of infection -Mentation improving off sedating medications, off sitter  -discharge planning  Dysphagia:  - In the background of dementia - SLP following - Dysphagia 2  With thickened liquids, PO INTAKE VERY POOR DUE TO THICKENED LIQUIDS AND HENCE Na keeps CLimbing -ask SLP to re-assess due to improving mentation to see if diet can be upgraded -Restart IV fluids today for 8-10 hours due to recurrent hypernatremia from decreased oral intake-  Leukocytosis: Suspect reactive to fall, fracture, resolved.   Acute blood loss anemia:  -Due to surgery 2/15,  -hb has drifted down to 7.5 today, likely a dilutional component as well Will give 1 unit of PRBC today - AKI: SCr up to  1.30 postop, returned to baseline 0.98.   -Stable  Orthostatic hypotension, dysautonomia:  - BP soft and + significant Orthostasis yesterday without any symptoms -Given IV fluids for 8 hours and increased Florinef dose back to 0.2 mg  CAD s/p angioplasty with stenting and h/o SVT:  - Continue ASA - not taking Cardizem as listed in med list  Hypokalemia: Resolved.  DVT prophylaxis: Lovenox Code Status: Full Family Communication: None at bedside today  Disposition Plan: SNF when Bed Available  Consultants: Ortho  Procedure Orthopedics, Dr. Renaye Rakers: ARTHROPLASTY BIPOLAR HIP (HEMIARTHROPLASTY) 03/10/2017  Antimicrobials: Ancef perioperatively   Subjective: -Having left hip/high pain and discomfort -Mentation remains more stable since overnight  Objective: BP 118/85 (BP Location: Left Arm)   Pulse 64   Temp 97.6 F (36.4 C) (Axillary)   Resp 16   Ht 6\' 2"  (1.88 m)   Wt 85.3 kg (188 lb)   SpO2 100%   BMI 24.14 kg/m    Gen: Awake, Alert, Oriented X 2, no distress  HEENT: PERRLA, Neck supple, no JVD Lungs: Good air movement bilaterally, CTAB CVS: RRR,No Gallops,Rubs or new Murmurs Abd: soft, Non tender, non distended, BS present Extremities: No edema, right hip incision healing well Skin: no new rashes   CBC: Recent Labs  Lab 03/13/17 0333 03/15/17 0503 03/17/17 1151 03/19/17 0407  WBC 8.9 8.7 6.9 7.5  HGB 8.3* 8.0* 7.3* 7.9*  HCT 26.2* 24.5* 23.0* 25.1*  MCV 94.9 93.5 96.2 95.4  PLT 193 236 270 295   Basic Metabolic Panel: Recent Labs  Lab 03/15/17 0503 03/17/17 1151 03/19/17 0407  NA 143 146* 147*  K 3.2* 3.2* 3.4*  CL 104 108 110  CO2 28 29 28   GLUCOSE  120* 128* 110*  BUN 23* 29* 25*  CREATININE 0.90 0.97 0.85  CALCIUM 8.9 8.5* 8.4*   Coagulation Profile: No results for input(s): INR, PROTIME in the last 168 hours.   LOS: 10 days   Time spent: 25 minutes.  Zannie CovePreetha Arfa Lamarca, MD [facility] [email or web address] Password [email or web address] 03/19/2017,  12:15 PM

## 2017-03-20 MED ORDER — POLYETHYLENE GLYCOL 3350 17 G PO PACK: 17.0000 g | PACK | Freq: Every day | ORAL | 0 refills | Status: AC | PRN

## 2017-03-20 MED ORDER — ACETAMINOPHEN 325 MG PO TABS: 650.0000 mg | ORAL_TABLET | Freq: Four times a day (QID) | ORAL | Status: AC | PRN

## 2017-03-20 MED ORDER — POTASSIUM CHLORIDE CRYS ER 20 MEQ PO TBCR
40.0000 meq | EXTENDED_RELEASE_TABLET | Freq: Once | ORAL | Status: AC
  Administered 2017-03-20: 40 meq via ORAL
  Filled 2017-03-20: qty 2

## 2017-03-20 NOTE — Progress Notes (Signed)
NURSING PROGRESS NOTE  Joe SignsRobert Mcpherson [id number] Discharge Data: 03/20/2017 12:09 PM Attending Provider: Zannie CoveJoseph, Preetha, MD ION:[id number]:Default, Provider, MD     [facility] Mcpher[facility]  Discussed with the patient the After Visit Summary and all questions fully answered. All IV's discontinued with no bleeding noted. All belongings returned to patient for patient to take home. Report called to [place], [facility].   Last Vital Mcpherson:  Blood pressure (!) 72/55, pulse (!) 132, temperature 98.8 F (37.1 C), temperature source Oral, resp. rate 18, height 6\' 2"  (1.88 m), weight 85.3 kg (188 lb), SpO2 98 %.  Discharge Medication List Allergies as of 03/20/2017   No Known Allergies     Medication List    TAKE these medications   acetaminophen 325 MG tablet Commonly known as:  TYLENOL Take 2 tablets (650 mg total) by mouth every 6 (six) hours as needed for mild pain (or Fever >/= 101).   aspirin EC 81 MG tablet Take 81 mg by mouth daily.   diltiazem 60 MG tablet Commonly known as:  CARDIZEM Take 1 tablet (60 mg total) by mouth once.   enoxaparin 40 MG/0.4ML injection Commonly known as:  LOVENOX Inject 0.4 mLs (40 mg total) into the skin daily for 30 doses. For 30 days post op for DVT prophylaxis   fludrocortisone 0.1 MG tablet Commonly known as:  FLORINEF Take 0.2 mg by mouth daily.   HYDROcodone-acetaminophen 5-325 MG tablet Commonly known as:  NORCO Take 1 tablet by mouth every 6 (six) hours as needed for moderate pain.   Melatonin 3 MG Tabs Take 3 mg by mouth at bedtime.   memantine 5 MG tablet Commonly known as:  NAMENDA Take 5 mg by mouth at bedtime.   mirtazapine 15 MG tablet Commonly known as:  REMERON Take 15 mg by mouth at bedtime.   polyethylene glycol packet Commonly known as:  MIRALAX / GLYCOLAX Take 17 g by mouth daily as needed for mild constipation.   potassium chloride 10 MEQ tablet Commonly known  as:  K-DUR Take 10 mEq by mouth daily.

## 2017-03-20 NOTE — Progress Notes (Signed)
  Speech Language Pathology Treatment: Dysphagia  Patient Details Name: Joe Mcpherson MRN: [id number] DOB: [birth year] Today's Date: 03/20/2017 Time: [id number] SLP Time Calculation (min) (ACUTE ONLY): 13 min  Assessment / Plan / Recommendation Clinical Impression  SLP asked to determine readiness for diet upgrade given pt poor consumption of nectar thick liquids and increasing sodium over the weekend. Pt seen after completion of am tray in which he consumed 2 oz of nectar thick juice, none of the pureed pancake (which is the incorrect texture as pt should be getting minced/ground foods) 1 oz of nectar thick milk. Checked in with NT who reported pt consumes nectar thick liquids without complaint.   Under SLP observation, pt consumed the rest of nectar thick juice. When asked what he thought about it, pt said, its fine, and denied recognizing any difference in texture. Suspect poor intake is secondary to distractibility and cognitive impairment; pt does not request PO and has to be repeatedly asked to take sips of plain water as well as thick juice. Pt has also independently reported significant weight loss leading up to his fall.   Overall, suspect that Joe Mcpherson has a chronic dysphagia related to pharyngeal weakness as well as dementia, that existed prior to this admission. Severity of problem has likely increased due to deconditioning and weakness. When MBS was completed last week, pt was observed to have severe pharyngeal residuals with all textures of liquids and risk of aspiration of all textures of liquids though quantity and severity of aspiration is greater with thin liquids. Given this finding, nectar thick liquids were recommended to reduce, but not eliminate risk. SLP discussed with MD. Overall, pt could resume thin liquids if it would decrease risk of dehydration. Regardless of texture pt is going to be at risk of dysphagia related aspiration pneumonia given a multitude of risk factors. A  conversation addressing pts plan of care would be benefical in this situation, if possible. Will defer diet decisions to MD and f/u for precautions and therapeutic interventions as warranted.    HPI HPI: Pt is a 84 y.o. male PMH oforthostatic hypotension on aldosterone antagonist, coronary artery disease status post stenting, SVT, dementia, hypertension who comes in after having a mechanical fall onto his right hip. Post-operative course complicated by acute delirium. CXR showed "COPD. Slightly increased right upper lobe lung markings as compared to the previous study may reflect atelectasis or early pneumonia." Also with leukocytosis which is suspected to b e reactive to fall/ fracture.      SLP Plan  Continue with current plan of care       Recommendations  Diet recommendations: Dysphagia 2 (fine chop);Nectar-thick liquid Liquids provided via: Cup;Straw Medication Administration: Crushed with puree                Oral Care Recommendations: Oral care BID Follow up Recommendations: Skilled Nursing facility SLP Visit Diagnosis: Dysphagia, oropharyngeal phase (R13.12) Plan: Continue with current plan of care       GO               [facility] Surgery CenterBonnie Lavaya Defreitas, MA CCC-SLP [phone]  Joe Mcpherson, Joe Mcpherson 03/20/2017, 9:55 AM

## 2017-03-20 NOTE — Discharge Summary (Signed)
Physician Discharge Summary  Joe Mcpherson ZOX:[id number] DOB: May 18, 1924 DOA: 03/09/2017  PCP: Default, Provider, MD  Admit date: 03/09/2017 Discharge date: 03/20/2017  Time spent: 35 minutes  Recommendations for Outpatient Follow-up:  1. PCP in 1 week 2. Dr[facility] in 1-2weeks 3. Please ensure Palliative Care Follow Up at SNF 4. Aspiration precautions 5. Stop Lovenox after 3 weeks   Discharge Diagnoses:  Principal Problem:   Hip fracture requiring operative repair (HCC)   Dementia   Orthostatic hypotension   Closed displaced fracture of right femoral neck (HCC)   CAD (coronary artery disease)   S/P angioplasty with stent   HTN (hypertension)   Dementia   Fall   History of hemiarthroplasty of hip   Discharge Condition: stable  Diet recommendation: Dysphagia 2 diet with thin liquids  Filed Weights   03/10/17 1040  Weight: 85.3 kg (188 lb)    History of present illness:  Joe Mcpherson is a 84 y.o. male with a history of dementia, CAD s/p PCI, HTN, orthostatic hypotension on florinef who presented to the ED after a mechanical fall found to have a right femoral neck fracture  Hospital Course:  Right femoral neck fracture: -Status post mechanical fall -Ortho consulted, underwent hemiarthroplasty 2/15 by Dr. Eulah Pont.  -WBAT RLE, continue rehabilitation at SNF. -Lovenox for DVT prophylaxis for 3 weeks  Acute delirium  -Hospitalization was complicated by Delirium -he has some  Mild Dementia at baseline -improved from this standpoint  -off sitter now  Dysphagia:  - In the background of dementia - SLP consulted, they felt that Overall,  Mr. Negron likely has chronic dysphagia related to pharyngeal weakness as well as dementia, that existed prior to this admission. Severity of problem has likely increased due to deconditioning and weakness -MBS was completed last week, pt was observed to have severe pharyngeal residuals with all textures of liquids and  risk of aspiration of all textures of liquids  -When we started him on thickened liquids his Po intake was quite poor and would develop recurrent Hypernatremia and hence after discussion with PoA [name] (niece) we decided to switch him back to thin liquids despite aspiration risk due to high risk of Dehydration -I discussed overall decline with Niece and we agreed to DNR for now and Palliative care FU in SNF   Leukocytosis: Suspect reactive to fall, fracture, resolved.   Acute blood loss anemia:  -Due to surgery 2/15,  -s/p 2 units PRBC post op - AKI: SCr up to 1.30 postop, returned to baseline 0.98.   -Stable  Orthostatic hypotension, dysautonomia:  - BP soft and + significant Orthostasis 2days ago without symptoms, briefly given IVF, since then Orthostatics improved and back on FLorinef 0.2mg  daily  CAD s/p angioplasty with stenting and h/o SVT:  - Continue ASA - not taking Cardizem as listed in med list  Hypokalemia: Repleted   Code Status: DNR  Consultants: Ortho  Procedure Orthopedics, Dr. Renaye Rakers: ARTHROPLASTY BIPOLAR HIP (HEMIARTHROPLASTY) 03/10/2017   Discharge Exam: Vitals:   03/19/17 1945 03/20/17 0600  BP: (!) 164/63 (!) 157/66  Pulse: 85 77  Resp: 17 18  Temp: 99.5 F (37.5 C) 98.8 F (37.1 C)  SpO2: 100% 98%    General: AAOx2 Cardiovascular: S1S2/RRR Respiratory: CTAB  Discharge Instructions   Discharge Instructions    Discharge instructions   Complete by:  As directed    Dysphagia 2 diet, thin liquids, aspiration precautions with all meals   Increase activity slowly   Complete by:  As  directed      Allergies as of 03/20/2017   No Known Allergies     Medication List    TAKE these medications   acetaminophen 325 MG tablet Commonly known as:  TYLENOL Take 2 tablets (650 mg total) by mouth every 6 (six) hours as needed for mild pain (or Fever >/= 101).   aspirin EC 81 MG tablet Take 81 mg by mouth daily.   diltiazem 60 MG  tablet Commonly known as:  CARDIZEM Take 1 tablet (60 mg total) by mouth once.   enoxaparin 40 MG/0.4ML injection Commonly known as:  LOVENOX Inject 0.4 mLs (40 mg total) into the skin daily for 30 doses. For 30 days post op for DVT prophylaxis   fludrocortisone 0.1 MG tablet Commonly known as:  FLORINEF Take 0.2 mg by mouth daily.   HYDROcodone-acetaminophen 5-325 MG tablet Commonly known as:  NORCO Take 1 tablet by mouth every 6 (six) hours as needed for moderate pain.   Melatonin 3 MG Tabs Take 3 mg by mouth at bedtime.   memantine 5 MG tablet Commonly known as:  NAMENDA Take 5 mg by mouth at bedtime.   mirtazapine 15 MG tablet Commonly known as:  REMERON Take 15 mg by mouth at bedtime.   polyethylene glycol packet Commonly known as:  MIRALAX / GLYCOLAX Take 17 g by mouth daily as needed for mild constipation.   potassium chloride 10 MEQ tablet Commonly known as:  K-DUR Take 10 mEq by mouth daily.      No Known Allergies  Contact information for follow-up providers    Sheral Apley, MD Follow up in 2 week(s).   Specialty:  Orthopedic Surgery Contact information: [address] [place] [address] [phone]        PCP. Schedule an appointment as soon as possible for a visit in 1 week(s).            Contact information for after-discharge care    Destination    [facility] Follow up.   Service:  Skilled Nursing Contact information: [address] [place] [address] [phone]                   The results of significant diagnostics from this hospitalization (including imaging, microbiology, ancillary and laboratory) are listed below for reference.    Significant Diagnostic Studies: Dg Chest 1 View  Result Date: 03/09/2017 CLINICAL DATA:  The patient slipped and fell today and has diffuse right hip pain. EXAM: CHEST 1 VIEW COMPARISON:  Portable chest x-ray of December 22, 2011 FINDINGS: The  lungs are hyperinflated. There is no focal infiltrate. The lung markings are increased however in the right upper lobe. There is scleroses of the posterior aspect of the right fourth rib which has progressed since the previous study. The heart is top-normal in size. The pulmonary vascularity is normal. There tortuosity of the descending thoracic aorta. No acute rib fracture is observed. IMPRESSION: COPD. Slightly increased right upper lobe lung markings as compared to the previous study may reflect atelectasis or early pneumonia. No CHF. Increased density within the posterior aspect of the right fourth rib. Thoracic aortic atherosclerosis. Electronically Signed   By: David  [place] M.D.   On: 03/09/2017 10:25   Pelvis Portable  Result Date: 03/10/2017 CLINICAL DATA:  Right hip replacement EXAM: PORTABLE PELVIS 1-2 VIEWS COMPARISON:  None. FINDINGS: Interval right hip arthroplasty without failure or complication. Postsurgical changes in the surrounding soft tissues. Left  hip arthroplasty without failure or complication. IMPRESSION: Interval right hip arthroplasty without failure or complication. Electronically Signed   By: Elige KoHetal  Patel   On: 03/10/2017 15:42   Dg Swallowing Func-speech Pathology  Result Date: 03/17/2017 Objective Swallowing Evaluation: Type of Study: MBS-Modified Barium Swallow Study  Patient Details Name: Gregary SignsRobert Mcreynolds MRN: [id number] Date of Birth: [birth year] Today's Date: 03/17/2017 Time: SLP Start Time (ACUTE ONLY): 0900 -SLP Stop Time (ACUTE ONLY): 0930 SLP Time Calculation (min) (ACUTE ONLY): 30 min Past Medical History: Past Medical History: Diagnosis Date . CAD (coronary artery disease) 03/09/2017 . Coronary artery disease  . Dementia 03/09/2017 . Hip fracture, unspecified laterality, closed, initial encounter (HCC) 03/09/2017 . HTN (hypertension) 03/09/2017 . Hypertension  . Orthostasis 03/09/2017 . S/P angioplasty with stent 03/09/2017 Past Surgical History: Past Surgical History: Procedure  Laterality Date . CORONARY STENT PLACEMENT   . HIP ARTHROPLASTY Right 03/10/2017  Procedure: ARTHROPLASTY BIPOLAR HIP (HEMIARTHROPLASTY);  Surgeon: Sheral ApleyMurphy, Timothy D, MD;  [facility];  Service: Orthopedics;  Laterality: Right; HPI: Pt is a 84 y.o. male PMH oforthostatic hypotension on aldosterone antagonist, coronary artery disease status post stenting, SVT, dementia, hypertension who comes in after having a mechanical fall onto his right hip. Post-operative course complicated by acute delirium. CXR showed "COPD. Slightly increased right upper lobe lung markings as compared to the previous study may reflect atelectasis or early pneumonia." Also with leukocytosis which is suspected to b e reactive to fall/ fracture.  No Data Recorded Assessment / Plan / Recommendation CHL IP CLINICAL IMPRESSIONS 03/17/2017 Clinical Impression Pt presents with mild to moderate weakness and slowed timing across the pharyngeal swallowing mechanism. Swallowing initiation with normal sized boluses occurrs at pyriforms sinus, there is moderate vallecular and pyriform residual present post swallow due to presumed mild weakness. if residuals are not cleared fully, subsequent swallows often result in silent penetration/aspiration of residue and head of next bolus. A chin tuck was minimally beneficial in reducing residual, but most important technique to minimize aspiration events was limiting bolus size with nectar thick liquids to prevent spill to pyriforms. Regardless, moderate residuals across consistencies will likely lead to aspiration events throughout PO intake. Will continue a nectar thick liquid diet with soft solids, though risk of aspiration from deconditioning following hip fracture is quite high for this pt. Pt should f/u with SLP at next level of care for pharyngeal strengthening, respiratory muscle strength training and aspiraiton precautions. Will follow while admitted acutely to initiate therapeutic interventions.  SLP  Visit Diagnosis -- Attention and concentration deficit following -- Frontal lobe and executive function deficit following -- Impact on safety and function --   CHL IP TREATMENT RECOMMENDATION 03/17/2017 Treatment Recommendations Therapy as outlined in treatment plan below   Prognosis 03/17/2017 Prognosis for Safe Diet Advancement Good Barriers to Reach Goals -- Barriers/Prognosis Comment -- CHL IP DIET RECOMMENDATION 03/17/2017 SLP Diet Recommendations Dysphagia 2 (Fine chop) solids;Nectar thick liquid Liquid Administration via Cup;Straw Medication Administration Crushed with puree Compensations Slow rate;Small sips/bites Postural Changes Seated upright at 90 degrees;Remain semi-upright after after feeds/meals (Comment)   CHL IP OTHER RECOMMENDATIONS 03/17/2017 Recommended Consults -- Oral Care Recommendations Oral care BID Other Recommendations Order thickener from pharmacy;Have oral suction available   CHL IP FOLLOW UP RECOMMENDATIONS 03/17/2017 Follow up Recommendations [facility] D[facility] 03/17/2017 Speech Therapy Frequency (ACUTE ONLY) min 2x/week Treatment Duration 2 weeks      CHL IP ORAL PHASE 03/17/2017 Oral Phase WFL Oral - Pudding Teaspoon --  Oral - Pudding Cup -- Oral - Honey Teaspoon -- Oral - Honey Cup -- Oral - Nectar Teaspoon -- Oral - Nectar Cup -- Oral - Nectar Straw -- Oral - Thin Teaspoon -- Oral - Thin Cup -- Oral - Thin Straw -- Oral - Puree -- Oral - Mech Soft -- Oral - Regular -- Oral - Multi-Consistency -- Oral - Pill -- Oral Phase - Comment --  CHL IP PHARYNGEAL PHASE 03/17/2017 Pharyngeal Phase Impaired Pharyngeal- Pudding Teaspoon -- Pharyngeal -- Pharyngeal- Pudding Cup -- Pharyngeal -- Pharyngeal- Honey Teaspoon -- Pharyngeal -- Pharyngeal- Honey Cup -- Pharyngeal -- Pharyngeal- Nectar Teaspoon -- Pharyngeal -- Pharyngeal- Nectar Cup Penetration/Aspiration before swallow;Penetration/Apiration after swallow;Reduced airway/laryngeal closure;Reduced laryngeal  elevation;Reduced anterior laryngeal mobility;Reduced epiglottic inversion;Reduced pharyngeal peristalsis;Delayed swallow initiation-pyriform sinuses;Reduced tongue base retraction;Pharyngeal residue - valleculae;Pharyngeal residue - pyriform;Trace aspiration;Compensatory strategies attempted (with notebox) Pharyngeal Material enters airway, CONTACTS cords and not ejected out;Material enters airway, remains ABOVE vocal cords and not ejected out;Material does not enter airway;Material enters airway, passes BELOW cords without attempt by patient to eject out (silent aspiration) Pharyngeal- Nectar Straw Penetration/Aspiration before swallow;Penetration/Apiration after swallow;Reduced airway/laryngeal closure;Reduced laryngeal elevation;Reduced anterior laryngeal mobility;Reduced epiglottic inversion;Reduced pharyngeal peristalsis;Delayed swallow initiation-pyriform sinuses;Reduced tongue base retraction;Pharyngeal residue - valleculae;Pharyngeal residue - pyriform;Trace aspiration;Compensatory strategies attempted (with notebox) Pharyngeal Material enters airway, CONTACTS cords and not ejected out;Material enters airway, remains ABOVE vocal cords and not ejected out;Material does not enter airway;Material enters airway, passes BELOW cords without attempt by patient to eject out (silent aspiration) Pharyngeal- Thin Teaspoon -- Pharyngeal -- Pharyngeal- Thin Cup Penetration/Aspiration before swallow;Penetration/Apiration after swallow;Reduced airway/laryngeal closure;Reduced laryngeal elevation;Reduced anterior laryngeal mobility;Reduced epiglottic inversion;Reduced pharyngeal peristalsis;Delayed swallow initiation-pyriform sinuses;Reduced tongue base retraction;Pharyngeal residue - valleculae;Pharyngeal residue - pyriform;Trace aspiration;Compensatory strategies attempted (with notebox) Pharyngeal Material enters airway, CONTACTS cords and not ejected out;Material enters airway, remains ABOVE vocal cords and not ejected  out;Material does not enter airway;Material enters airway, passes BELOW cords without attempt by patient to eject out (silent aspiration) Pharyngeal- Thin Straw Penetration/Aspiration before swallow;Penetration/Apiration after swallow;Reduced airway/laryngeal closure;Reduced laryngeal elevation;Reduced anterior laryngeal mobility;Reduced epiglottic inversion;Reduced pharyngeal peristalsis;Delayed swallow initiation-pyriform sinuses;Reduced tongue base retraction;Pharyngeal residue - valleculae;Pharyngeal residue - pyriform;Trace aspiration;Compensatory strategies attempted (with notebox) Pharyngeal Material enters airway, CONTACTS cords and not ejected out;Material enters airway, remains ABOVE vocal cords and not ejected out;Material does not enter airway;Material enters airway, passes BELOW cords without attempt by patient to eject out (silent aspiration) Pharyngeal- Puree Reduced airway/laryngeal closure;Reduced laryngeal elevation;Reduced anterior laryngeal mobility;Reduced epiglottic inversion;Reduced pharyngeal peristalsis;Delayed swallow initiation-pyriform sinuses;Reduced tongue base retraction;Pharyngeal residue - valleculae;Pharyngeal residue - pyriform;Compensatory strategies attempted (with notebox) Pharyngeal Material does not enter airway Pharyngeal- Mechanical Soft Reduced airway/laryngeal closure;Reduced laryngeal elevation;Reduced anterior laryngeal mobility;Reduced epiglottic inversion;Reduced pharyngeal peristalsis;Delayed swallow initiation-pyriform sinuses;Reduced tongue base retraction;Pharyngeal residue - valleculae;Pharyngeal residue - pyriform;Compensatory strategies attempted (with notebox) Pharyngeal -- Pharyngeal- Regular -- Pharyngeal -- Pharyngeal- Multi-consistency -- Pharyngeal -- Pharyngeal- Pill -- Pharyngeal -- Pharyngeal Comment --  No flowsheet data found. No flowsheet data found. Harlon Ditty, MA CCC-SLP [phone] Claudine Mouton 03/17/2017, 10:32 AM              Dg  Hip Unilat  With Pelvis 2-3 Views Right  Result Date: 03/09/2017 CLINICAL DATA:  Status post fall today with diffuse right hip pain. EXAM: DG HIP (WITH OR WITHOUT PELVIS) 2-3V RIGHT COMPARISON:  None in PACs FINDINGS: Patient has sustained an acute subcapital fracture of the right hip. The right femoral head remains appropriately position with respect to the acetabulum. There is  mild right hip joint space narrowing. The intertrochanteric and immediate subtrochanteric regions of the right hip are intact. 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 IMPRESSION: There is an acute subcapital fracture of the right hip. Electronically Signed   By: David  [place] M.D.   On: 03/09/2017 10:26    Microbiology: No results found for this or any previous visit (from the past 240 hour(s)).   Labs: Basic Metabolic Panel: Recent Labs  Lab 03/15/17 0503 03/17/17 1151 03/19/17 0407  NA 143 146* 147*  K 3.2* 3.2* 3.4*  CL 104 108 110  CO2 28 29 28   GLUCOSE 120* 128* 110*  BUN 23* 29* 25*  CREATININE 0.90 0.97 0.85  CALCIUM 8.9 8.5* 8.4*   Liver Function Tests: No results for input(s): AST, ALT, ALKPHOS, BILITOT, PROT, ALBUMIN in the last 168 hours. No results for input(s): LIPASE, AMYLASE in the last 168 hours. No results for input(s): AMMONIA in the last 168 hours. CBC: Recent Labs  Lab 03/15/17 0503 03/17/17 1151 03/19/17 0407  WBC 8.7 6.9 7.5  HGB 8.0* 7.3* 7.9*  HCT 24.5* 23.0* 25.1*  MCV 93.5 96.2 95.4  PLT 236 270 295   Cardiac Enzymes: No results for input(s): CKTOTAL, CKMB, CKMBINDEX, TROPONINI in the last 168 hours. BNP: BNP (last 3 results) No results for input(s): BNP in the last 8760 hours.  ProBNP (last 3 results) No results for input(s): PROBNP in the last 8760 hours.  CBG: No results for input(s): GLUCAP in the last 168 hours.     Signed:  Zannie Cove MD.  [facility] 03/20/2017, 10:42 AM

## 2017-03-20 NOTE — Progress Notes (Addendum)
Physical Therapy Treatment Patient Details Name: Joe SignsRobert Mcpherson MRN: [id number] DOB: 10-17-1924 Today's Date: 03/20/2017    History of Present Illness Pt is a 84 y.o. male who presented to the ED after a mechanical fall and found to have a R femoral neck fracture. Now s/p R bipolar hip hemiarthroplasty. PMH significant for orthostatic hypotension, SVT, dementia, and hypertension.     PT Comments    Patient is very pleasant and eager to participate in therapy. Pt limited by orthostatic BP and elevated HR (see general comments below). Pt required min A overall for mobility this session. Continue to progress as tolerated with anticipated d/c to SNF for further skilled PT services.      Follow Up Recommendations  SNF;Supervision/Assistance - 24 hour     Equipment Recommendations  None recommended by PT    Recommendations for Other Services       Precautions / Restrictions Precautions Precautions: Posterior Hip;Fall Precaution Comments: Limited understanding of precautions due to cognitive deficits Restrictions Weight Bearing Restrictions: Yes RLE Weight Bearing: Weight bearing as tolerated    Mobility  Bed Mobility Overal bed mobility: Needs Assistance Bed Mobility: Supine to Sit Rolling: Min assist         General bed mobility comments: assist to elevate trunk into sitting; cues for sequencing; use of rail  Transfers Overall transfer level: Needs assistance Equipment used: Rolling walker (2 wheeled) Transfers: Sit to/from [employer];Stand Pivot Transfers Sit to Stand: Min assist Stand pivot transfers: Min assist       General transfer comment: assist to power up into standing and to gain balance upon stand from EOB, recliner, and BSC; cues for safe hand placement and sequencing  Ambulation/Gait                 Stairs            Wheelchair Mobility    Modified Rankin (Stroke Patients Only)       Balance Overall balance assessment: Needs  assistance Sitting-balance support: Feet supported;Bilateral upper extremity supported Sitting balance-Leahy Scale: Fair     Standing balance support: Bilateral upper extremity supported Standing balance-Leahy Scale: Poor                              Cognition Arousal/Alertness: Awake/alert Behavior During Therapy: WFL for tasks assessed/performed Overall Cognitive Status: History of cognitive impairments - at baseline                                        Exercises      General Comments General comments (skin integrity, edema, etc.): BP in sitting 134/64 with HR 87 and in standing BP 72/55 and HR 132      Pertinent Vitals/Pain Pain Assessment: Faces Faces Pain Scale: Hurts little more Pain Location: R hip Pain Descriptors / Indicators: Guarding;Grimacing Pain Intervention(s): Limited activity within patient's tolerance;Monitored during session;Premedicated before session;Repositioned    Home Living                      Prior Function            PT Goals (current goals can now be found in the care plan section) Acute Rehab PT Goals PT Goal Formulation: With patient Time For Goal Achievement: 03/18/17 Potential to Achieve Goals: Good Progress towards PT goals: Progressing toward goals  Frequency    Min 3X/week      PT Plan Current plan remains appropriate    Co-evaluation              AM-PAC PT "6 Clicks" Daily Activity  Outcome Measure  Difficulty turning over in bed (including adjusting bedclothes, sheets and blankets)?: Unable Difficulty moving from lying on back to sitting on the side of the bed? : Unable Difficulty sitting down on and standing up from a chair with arms (e.g., wheelchair, bedside commode, etc,.)?: Unable Help needed moving to and from a bed to chair (including a wheelchair)?: A Little Help needed walking in hospital room?: A Lot Help needed climbing 3-5 steps with a railing? : Total 6  Click Score: 9    End of Session Equipment Utilized During Treatment: Gait belt Activity Tolerance: Other (comment)(limited by orthostasis) Patient left: with call bell/phone within reach;in chair;with chair alarm set Nurse Communication: Mobility status PT Visit Diagnosis: Unsteadiness on feet (R26.81);Other abnormalities of gait and mobility (R26.89);Pain;Muscle weakness (generalized) (M62.81) Pain - Right/Left: Right Pain - part of body: Hip     Time: 9147-8295 PT Time Calculation (min) (ACUTE ONLY): 28 min  Charges:  $Therapeutic Activity: 23-37 mins                    G Codes:       Erline Levine, PTA Pager: [phone]     Carolynne Edouard 03/20/2017, 1:12 PM

## 2017-03-20 NOTE — Social Work (Addendum)
Clinical Social Worker facilitated patient discharge including contacting patient family and facility to confirm patient discharge plans.  Clinical information faxed to facility and family agreeable with plan.    CSW arranged [facility].    RN to call [phone] to give report prior to discharge. Pt going to Room 1008P.  Clinical Social Worker will sign off for now as social work intervention is no longer needed. Please consult us again if new need arises.  Keene BreathPatricia Jaquis Picklesimer, LCSW Clinical Social Worker [phone]

## 2017-03-20 NOTE — Social Work (Addendum)
CSW contacted SNF and they confirmed that they have not received Insurance Auth for patient yet.  CSW f/u with PT to see patient again to get an updated PT note for insurance.  CSW will f/u.  10:22am: SNF called CSW and advised that they received Insurance Auth for [facility] placement [facility].  CSW will f/u with clinical staff on same.  10:45am: CSW discussed disposition with [name], family member and she is in agreement with transportation to SNF today.  Keene BreathPatricia Cathie Bonnell, LCSW Clinical Social Worker [phone]

## 2017-03-20 NOTE — Clinical Social Work Placement (Signed)
   CLINICAL SOCIAL WORK PLACEMENT  NOTE  Date:  03/20/2017  Patient Details  Name: Joe Mcpherson MRN: [id number] Date of Birth: 04-20-1924  Clinical Social Work is seeking post-discharge placement for this patient at the Skilled  Nursing Facility level of care (*CSW will initial, date and re-position this form in  chart as items are completed):  Yes   Patient/family provided with [facility] Social Work Department's list of facilities offering this level of care within the geographic area requested by the patient (or if unable, by the patient's family).  Yes   Patient/family informed of their freedom to choose among providers that offer the needed level of care, that participate in Medicare, Medicaid or managed care program needed by the patient, have an available bed and are willing to accept the patient.  Yes   Patient/family informed of [facility] ownership interest in [facility] and [facility], as well as of the fact that they are under no obligation to receive care at these facilities.  PASRR submitted to EDS on       PASRR number received on 03/12/17     Existing PASRR number confirmed on       FL2 transmitted to all facilities in geographic area requested by pt/family on 03/12/17     FL2 transmitted to all facilities within larger geographic area on       Patient informed that his/her managed care company has contracts with or will negotiate with certain facilities, including the following:        Yes   Patient/family informed of bed offers received.  [facility]     Physician recommends and patient chooses bed at      [facility].  Patient to be transferred to facility by [facility]     Patient family notified on 03/20/17 of transfer.  Name of family member notified:  neice advised     PHYSICIAN       Additional Comment:    _______________________________________________ Tresa MoorePatricia  V Cianni Manny, LCSW 03/20/2017, 10:35 AM

## 2017-03-20 NOTE — Progress Notes (Signed)
Spoke with Rory PercyKellyn, PT she informed me of the patient's orthostatic BP's. Sitting was 134/64 and standing was 72/55. I informed Dr. Edison SimonJoseph's and she said this is chronic for him and she is fine with still discharging the patient.

## 2017-04-09 ENCOUNTER — Emergency Department (HOSPITAL_COMMUNITY): Payer: Medicare HMO

## 2017-04-09 ENCOUNTER — Observation Stay (HOSPITAL_COMMUNITY)
Admission: EM | Admit: 2017-04-09 | Discharge: 2017-04-24 | Disposition: E | Payer: Medicare HMO | Attending: Internal Medicine | Admitting: Internal Medicine

## 2017-04-09 DIAGNOSIS — R55 Syncope and collapse: Secondary | ICD-10-CM | POA: Diagnosis present

## 2017-04-09 DIAGNOSIS — F0390 Unspecified dementia without behavioral disturbance: Secondary | ICD-10-CM | POA: Insufficient documentation

## 2017-04-09 DIAGNOSIS — I259 Chronic ischemic heart disease, unspecified: Secondary | ICD-10-CM | POA: Insufficient documentation

## 2017-04-09 DIAGNOSIS — Z515 Encounter for palliative care: Secondary | ICD-10-CM | POA: Diagnosis not present

## 2017-04-09 DIAGNOSIS — I6200 Nontraumatic subdural hemorrhage, unspecified: Secondary | ICD-10-CM | POA: Diagnosis not present

## 2017-04-09 DIAGNOSIS — L8990 Pressure ulcer of unspecified site, unspecified stage: Secondary | ICD-10-CM

## 2017-04-09 DIAGNOSIS — I6201 Nontraumatic acute subdural hemorrhage: Secondary | ICD-10-CM | POA: Diagnosis not present

## 2017-04-09 DIAGNOSIS — Z79899 Other long term (current) drug therapy: Secondary | ICD-10-CM | POA: Insufficient documentation

## 2017-04-09 DIAGNOSIS — Z7982 Long term (current) use of aspirin: Secondary | ICD-10-CM | POA: Insufficient documentation

## 2017-04-09 DIAGNOSIS — I10 Essential (primary) hypertension: Secondary | ICD-10-CM | POA: Diagnosis not present

## 2017-04-09 DIAGNOSIS — Z7901 Long term (current) use of anticoagulants: Secondary | ICD-10-CM | POA: Diagnosis not present

## 2017-04-09 LAB — I-STAT TROPONIN, ED: Troponin i, poc: 0.26 ng/mL (ref 0.00–0.08)

## 2017-04-09 LAB — I-STAT CHEM 8, ED
BUN: 17 mg/dL (ref 6–20)
CHLORIDE: 103 mmol/L (ref 101–111)
Calcium, Ion: 1.21 mmol/L (ref 1.15–1.40)
Creatinine, Ser: 0.8 mg/dL (ref 0.61–1.24)
Glucose, Bld: 179 mg/dL — ABNORMAL HIGH (ref 65–99)
HCT: 27 % — ABNORMAL LOW (ref 39.0–52.0)
Hemoglobin: 9.2 g/dL — ABNORMAL LOW (ref 13.0–17.0)
POTASSIUM: 3.9 mmol/L (ref 3.5–5.1)
SODIUM: 145 mmol/L (ref 135–145)
TCO2: 33 mmol/L — ABNORMAL HIGH (ref 22–32)

## 2017-04-09 LAB — URINALYSIS, ROUTINE W REFLEX MICROSCOPIC
Bilirubin Urine: NEGATIVE
GLUCOSE, UA: NEGATIVE mg/dL
HGB URINE DIPSTICK: NEGATIVE
Ketones, ur: NEGATIVE mg/dL
Leukocytes, UA: NEGATIVE
Nitrite: NEGATIVE
PH: 7 (ref 5.0–8.0)
Protein, ur: NEGATIVE mg/dL
SPECIFIC GRAVITY, URINE: 1.006 (ref 1.005–1.030)
Squamous Epithelial / LPF: NONE SEEN

## 2017-04-09 LAB — CBC WITH DIFFERENTIAL/PLATELET
Basophils Absolute: 0 10*3/uL (ref 0.0–0.1)
Basophils Relative: 0 %
EOS ABS: 0 10*3/uL (ref 0.0–0.7)
EOS PCT: 0 %
HCT: 28.6 % — ABNORMAL LOW (ref 39.0–52.0)
Hemoglobin: 8.8 g/dL — ABNORMAL LOW (ref 13.0–17.0)
LYMPHS ABS: 0.5 10*3/uL — AB (ref 0.7–4.0)
Lymphocytes Relative: 5 %
MCH: 31.1 pg (ref 26.0–34.0)
MCHC: 30.8 g/dL (ref 30.0–36.0)
MCV: 101.1 fL — ABNORMAL HIGH (ref 78.0–100.0)
MONO ABS: 1.4 10*3/uL — AB (ref 0.1–1.0)
Monocytes Relative: 13 %
Neutro Abs: 9 10*3/uL — ABNORMAL HIGH (ref 1.7–7.7)
Neutrophils Relative %: 82 %
PLATELETS: 270 10*3/uL (ref 150–400)
RBC: 2.83 MIL/uL — AB (ref 4.22–5.81)
RDW: 15.6 % — AB (ref 11.5–15.5)
WBC: 10.9 10*3/uL — AB (ref 4.0–10.5)

## 2017-04-09 LAB — I-STAT CG4 LACTIC ACID, ED: Lactic Acid, Venous: 1.1 mmol/L (ref 0.5–1.9)

## 2017-04-09 LAB — GLUCOSE, CAPILLARY: Glucose-Capillary: 157 mg/dL — ABNORMAL HIGH (ref 65–99)

## 2017-04-09 MED ORDER — PIPERACILLIN-TAZOBACTAM 3.375 G IVPB 30 MIN
3.3750 g | Freq: Once | INTRAVENOUS | Status: AC
  Administered 2017-04-09: 3.375 g via INTRAVENOUS
  Filled 2017-04-09: qty 50

## 2017-04-09 MED ORDER — IOPAMIDOL (ISOVUE-370) INJECTION 76%
INTRAVENOUS | Status: AC
  Filled 2017-04-09: qty 100

## 2017-04-09 MED ORDER — VANCOMYCIN HCL 10 G IV SOLR
1500.0000 mg | Freq: Once | INTRAVENOUS | Status: DC
  Administered 2017-04-09: 1500 mg via INTRAVENOUS
  Filled 2017-04-09: qty 1500

## 2017-04-09 MED ORDER — PHENYLEPHRINE 40 MCG/ML (10ML) SYRINGE FOR IV PUSH (FOR BLOOD PRESSURE SUPPORT)
100.0000 ug | PREFILLED_SYRINGE | Freq: Once | INTRAVENOUS | Status: DC
  Filled 2017-04-09: qty 10

## 2017-04-09 NOTE — ED Notes (Signed)
Pt's family is at bedside, they are requesting a slight delay in Dc'ing of pt's care to [facility]." I have relayed this to the care hand off nurse, family does not have an ETA for the priest.

## 2017-04-09 NOTE — ED Triage Notes (Addendum)
Pt is normally able to walk and talk. Found unresponsive at facility after lunch at facility. Pt is snoring respirations, unresponsive to painful stimuli VS 140/80 FSBG 141

## 2017-04-09 NOTE — ED Notes (Signed)
Bed: WA17 Expected date:  Expected time:  Means of arrival:  Comments: 84 yo non-responsive, DNR

## 2017-04-09 NOTE — ED Notes (Signed)
Troponin given to MD and RN.

## 2017-04-09 NOTE — Progress Notes (Signed)
Pt removed from BIPAP and placed on NRB for transport to 3w for comfort care.  Pt tolerating well, RT to monitor and assess as needed.

## 2017-04-09 NOTE — H&P (Signed)
History and Physical    Joe SignsRobert Braniff ZOX:[id number] DOB: 01-25-1924 DOA: 03/29/2017  PCP: Default, Provider, MD  Patient coming from: SNF.   I have personally briefly reviewed patient's [facility]  Chief Complaint: brought in for unresponsiveness.   HPI: Joe Mcpherson is a 84 y.o. male with medical history significant of recent. Hip repair, CAD, dementia, hypertension. Dementia, ws brought in from SNF for unresponsiveness. On arrival to ED, pt is hypoxic, requiring 100% oxygen and put on BIPAP and he underwent ct head showing massive sub dural hemorrhage. EDP physician spoke to the POA, the niece , wanted comfort measures for the patient.   Review of Systems:couldnt' be obtained as pt is unresponsive.   Past Medical History:  Diagnosis Date  . CAD (coronary artery disease) 03/09/2017  . Coronary artery disease   . Dementia 03/09/2017  . Hip fracture, unspecified laterality, closed, initial encounter (HCC) 03/09/2017  . HTN (hypertension) 03/09/2017  . Hypertension   . Orthostasis 03/09/2017  . S/P angioplasty with stent 03/09/2017    Past Surgical History:  Procedure Laterality Date  . CORONARY STENT PLACEMENT    . HIP ARTHROPLASTY Right 03/10/2017   Procedure: ARTHROPLASTY BIPOLAR HIP (HEMIARTHROPLASTY);  Surgeon: Sheral ApleyMurphy, Timothy D, MD;  [facility];  Service: Orthopedics;  Laterality: Right;     has no tobacco, alcohol, and drug history on file.  No Known Allergies  No family history on file. family history coulnd't be obtained as pt is unresponsive.   Prior to Admission medications   Medication Sig Start Date End Date Taking? Authorizing Provider  acetaminophen (TYLENOL) 325 MG tablet Take 2 tablets (650 mg total) by mouth every 6 (six) hours as needed for mild pain (or Fever >/= 101). 03/20/17  Yes Zannie CoveJoseph, Preetha, MD  acetaminophen (TYLENOL) 325 MG tablet Take 650 mg by mouth 2 (two) times daily.   Yes [provider]  aspirin EC 81 MG  tablet Take 81 mg by mouth daily.   Yes [provider]  enoxaparin (LOVENOX) 40 MG/0.4ML injection Inject 0.4 mLs (40 mg total) into the skin daily for 30 doses. For 30 days post op for DVT prophylaxis 03/10/17 04/02/2017 Yes Albina BilletMartensen, Henry Calvin III, PA-C  ferrous sulfate 325 (65 FE) MG tablet Take 325 mg by mouth 2 (two) times daily with a meal.   Yes [provider]  fludrocortisone (FLORINEF) 0.1 MG tablet Take 0.2 mg by mouth daily.    Yes [provider]  Melatonin 3 MG TABS Take 3 mg by mouth at bedtime.   Yes [provider]  memantine (NAMENDA) 5 MG tablet Take 5 mg by mouth at bedtime.   Yes [provider]  mirtazapine (REMERON) 15 MG tablet Take 15 mg by mouth at bedtime.   Yes [provider]  polyethylene glycol (MIRALAX / GLYCOLAX) packet Take 17 g by mouth daily as needed for mild constipation. 03/20/17  Yes Zannie CoveJoseph, Preetha, MD  potassium chloride (K-DUR) 10 MEQ tablet Take 10 mEq by mouth daily.   Yes [provider]  vitamin C (ASCORBIC ACID) 500 MG tablet Take 500 mg by mouth every evening.   Yes [provider]  diltiazem (CARDIZEM) 60 MG tablet Take 1 tablet (60 mg total) by mouth once. Patient not taking: Reported on 04/13/2017 12/22/11   Sunnie Nielsenpitz, Brian, MD  HYDROcodone-acetaminophen [facility](NORCO) 5-325 MG tablet Take 1 tablet by mouth every 6 (six) hours as needed for moderate pain. Patient not taking: Reported on  04/16/2017 03/10/17   Albina Billet III, PA-C    Physical Exam: Vitals:   04/07/2017 1545 04/08/2017 1600 04/22/2017 1615 04/07/2017 1628  BP: 126/80 (!) 110/58 106/65 105/66  Pulse: 67 65 68 74  Resp: 17 19 (!) 21 (!) 21  Temp: (!) 94.5 F (34.7 C) (!) 94.3 F (34.6 C) (!) 94.1 F (34.5 C)   TempSrc:      SpO2: 98% 97% 96% 96%  Weight:      Height:        Constitutional: pt unresponsive  Vitals:   04/08/2017 1545 04/06/2017 1600 04/21/2017 1615 04/11/2017 1628  BP: 126/80 (!) 110/58 106/65  105/66  Pulse: 67 65 68 74  Resp: 17 19 (!) 21 (!) 21  Temp: (!) 94.5 F (34.7 C) (!) 94.3 F (34.6 C) (!) 94.1 F (34.5 C)   TempSrc:      SpO2: 98% 97% 96% 96%  Weight:      Height:       Eyes: pupils sluggish ENMT: on BIPAP.  Respiratory: BILATERAL rhonchi.  Cardiovascular:tachycardic. No murmer.  Abdomen: appears benign.  Musculoskeletal: no pedal edema.  Skin: no rashes, lesions, ulcers. No induration Neurologic: unresponsive.      Labs on Admission: I have personally reviewed following labs and imaging studies  CBC: Recent Labs  Lab 04/06/2017 1359 04/16/2017 1415  WBC 10.9*  --   NEUTROABS 9.0*  --   HGB 8.8* 9.2*  HCT 28.6* 27.0*  MCV 101.1*  --   PLT 270  --    Basic Metabolic Panel: Recent Labs  Lab 04/16/2017 1415  NA 145  K 3.9  CL 103  GLUCOSE 179*  BUN 17  CREATININE 0.80   GFR: Estimated Creatinine Clearance: 71.4 mL/min (by C-G formula based on SCr of 0.8 mg/dL). Liver Function Tests: No results for input(s): AST, ALT, ALKPHOS, BILITOT, PROT, ALBUMIN in the last 168 hours. No results for input(s): LIPASE, AMYLASE in the last 168 hours. No results for input(s): AMMONIA in the last 168 hours. Coagulation Profile: No results for input(s): INR, PROTIME in the last 168 hours. Cardiac Enzymes: No results for input(s): CKTOTAL, CKMB, CKMBINDEX, TROPONINI in the last 168 hours. BNP (last 3 results) No results for input(s): PROBNP in the last 8760 hours. HbA1C: No results for input(s): HGBA1C in the last 72 hours. CBG: No results for input(s): GLUCAP in the last 168 hours. Lipid Profile: No results for input(s): CHOL, HDL, LDLCALC, TRIG, CHOLHDL, LDLDIRECT in the last 72 hours. Thyroid Function Tests: No results for input(s): TSH, T4TOTAL, FREET4, T3FREE, THYROIDAB in the last 72 hours. Anemia Panel: No results for input(s): VITAMINB12, FOLATE, FERRITIN, TIBC, IRON, RETICCTPCT in the last 72 hours. Urine analysis:    Component Value Date/Time     COLORURINE YELLOW 04/23/2017 1422   APPEARANCEUR CLEAR 04/18/2017 1422   LABSPEC 1.006 04/04/2017 1422   PHURINE 7.0 04/21/2017 1422   GLUCOSEU NEGATIVE 03/25/2017 1422   HGBUR NEGATIVE 03/28/2017 1422   BILIRUBINUR NEGATIVE 03/30/2017 1422   KETONESUR NEGATIVE 04/02/2017 1422   PROTEINUR NEGATIVE 03/31/2017 1422   NITRITE NEGATIVE 04/08/2017 1422   LEUKOCYTESUR NEGATIVE 04/16/2017 1422    Radiological Exams on Admission: Ct Head Wo Contrast  Result Date: 04/09/2017 CLINICAL DATA:  84 y/o  M; found unresponsive. EXAM: CT HEAD WITHOUT CONTRAST TECHNIQUE: Contiguous axial images were obtained from the base of the skull through the vertex without intravenous contrast. COMPARISON:  None. FINDINGS: Brain: Massive subdural hematoma over the right cerebral convexity  measuring up to 3.7 cm in thickness (series 4, image 32). Additionally, there are several small foci of brain parenchymal hemorrhage within the pons as well as the body of corpus callosum. Edema and mass effect results in 25 mm of right-to-left midline shift. Right to left subfalcine herniation, diffuse effacement of basilar cisterns and downward transtentorial herniation. There is entrapment of the left lateral ventricle due to effacement of third and right lateral ventricles. Hypoattenuation of brain parenchyma and paramedian frontal lobe along falx may represent areas of infarction or edema. Vascular: No hyperdense vessel or unexpected calcification. Skull: Normal. Negative for fracture or focal lesion. Sinuses/Orbits: No acute finding. Other: None. IMPRESSION: 1. Massive acute subdural hemorrhage over the right cerebral convexity. Small foci of parenchymal hemorrhage and body of corpus callosum and pons. 2. Edema and mass effect with 25 mm right-to-left midline shift, right to left subfalcine herniation, and downward transtentorial herniation. Entrapment of left lateral ventricle. Critical Value/emergent results were called by telephone  at the time of interpretation on 2017-05-01 at 4:04 pm to Dr. Alveria Apley , who verbally acknowledged these results. Electronically Signed   By: Mitzi Hansen M.D.   On: May 01, 2017 16:08   Dg Chest Portable 1 View  Result Date: May 01, 2017 CLINICAL DATA:  Found unresponsive today at facility EXAM: PORTABLE CHEST 1 VIEW COMPARISON:  03/09/2017 FINDINGS: The cardiac silhouette, mediastinal and hilar contours are within normal limits and stable. There is tortuosity of the thoracic aorta. Prominent skin folds are noted over both lungs. No pneumothorax or pleural effusion. No infiltrates or edema. Remote healed rib fractures are noted. IMPRESSION: No acute cardiopulmonary findings. Electronically Signed   By: Rudie Meyer M.D.   On: 05-01-2017 14:39      Assessment/Plan Active Problems:   Subdural hemorrhage (HCC)  Acute sub dural hematoma  Would explain the unresponsiveness. Pt POA, would like to transition patient to comfort care.   D/c BIPAP, put the patient on NRB.  PALLIATIVE care consulted for symptomatic management.      DVT prophylaxis: comfort measures. Code Status: DNR.  Family Communication: DISCUSSED WITH niece over the phone.  Disposition Plan: suspect pt will have hospital death.  Consults called: palliative care. Admission status: obs.   Kathlen Mody MD [facility] Pager [phone]  If 7PM-7AM, please contact night-coverage [email or web address] Password [facility]  05-01-17, 4:42 PM

## 2017-04-09 NOTE — ED Notes (Signed)
Date and time results received: 04/19/2017 1539  (use smartphrase ".now" to insert current time)  Test: Head CT Critical Value: Head bleed per Radiology Tech  Name of Provider Notified: Dr. Clarene DukeLittle MD and Lillie ColumbiaSophia C PA_C  Orders Received? Or Actions Taken?: Providers came to bedside.

## 2017-04-09 NOTE — ED Notes (Signed)
ED TO INPATIENT HANDOFF REPORT  Name/Age/Gender Gregary Signs 84 y.o. male  Code Status    Code Status Orders  (From admission, onward)        Start     Ordered   03/30/2017 1650  Do not attempt resuscitation/DNR  Continuous    Question Answer Comment  In the event of cardiac or respiratory ARREST Do not call a "code blue"   In the event of cardiac or respiratory ARREST Do not perform Intubation, CPR, defibrillation or ACLS   In the event of cardiac or respiratory ARREST Use medication by any route, position, wound care, and other measures to relive pain and suffering. May use oxygen, suction and manual treatment of airway obstruction as needed for comfort.   Comments Dr. Clarene Duke spoke with Denyse Amass who confirms DNR      04/07/2017 1650    Code Status History    Date Active Date Inactive Code Status Order ID Comments User Context   03/20/2017 10:20 03/20/2017 18:05 DNR [id number]  Zannie Cove, MD Inpatient   03/09/2017 14:27 03/20/2017 10:20 Full Code [id number]  Delaine Lame, MD Inpatient      Home/SNF/Other Nursing Home  Chief Complaint comatose  Level of Care/Admitting Diagnosis ED Disposition    ED Disposition Condition Comment   Admit  Hospital Area: [facility] [[id number]]  Level of Care: Med-Surg [16]  Diagnosis: Subdural hemorrhage (HCC) [432.1.ICD-9-CM]  Admitting Physician: Kathlen Mody [[id number]]  Attending Physician: Kathlen Mody [[id number]]  PT Class (Do Not Modify): Observation [104]  PT Acc Code (Do Not Modify): Observation [10022]       Medical History Past Medical History:  Diagnosis Date  . CAD (coronary artery disease) 03/09/2017  . Coronary artery disease   . Dementia 03/09/2017  . Hip fracture, unspecified laterality, closed, initial encounter (HCC) 03/09/2017  . HTN (hypertension) 03/09/2017  . Hypertension   . Orthostasis 03/09/2017  . S/P angioplasty with stent 03/09/2017    Allergies No Known Allergies  IV  Location/Drains/Wounds Patient Lines/Drains/Airways Status   Active Line/Drains/Airways    Name:   Placement date:   Placement time:   Site:   Days:   Peripheral IV 04/21/2017 Left Antecubital   04/16/2017    -    Antecubital   less than 1   Peripheral IV 04/17/2017 Right Forearm   04/06/2017    1424    Forearm   less than 1          Labs/Imaging Results for orders placed or performed during the hospital encounter of 04/11/2017 (from the past 48 hour(s))  CBC with Differential     Status: Abnormal   Collection Time: 03/30/2017  1:59 PM  Result Value Ref Range   WBC 10.9 (H) 4.0 - 10.5 K/uL   RBC 2.83 (L) 4.22 - 5.81 MIL/uL   Hemoglobin 8.8 (L) 13.0 - 17.0 g/dL   HCT 44.0 (L) 10.2 - 72.5 %   MCV 101.1 (H) 78.0 - 100.0 fL   MCH 31.1 26.0 - 34.0 pg   MCHC 30.8 30.0 - 36.0 g/dL   RDW 36.6 (H) 44.0 - 34.7 %   Platelets 270 150 - 400 K/uL   Neutrophils Relative % 82 %   Neutro Abs 9.0 (H) 1.7 - 7.7 K/uL   Lymphocytes Relative 5 %   Lymphs Abs 0.5 (L) 0.7 - 4.0 K/uL   Monocytes Relative 13 %   Monocytes Absolute 1.4 (H) 0.1 - 1.0 K/uL   Eosinophils Relative 0 %  Eosinophils Absolute 0.0 0.0 - 0.7 K/uL   Basophils Relative 0 %   Basophils Absolute 0.0 0.0 - 0.1 K/uL    Comment: Performed at [facility], [address], [place], [place] [address]  Blood gas, venous     Status: Abnormal (Preliminary result)   Collection Time: 14-Apr-2017  2:03 PM  Result Value Ref Range   pH, Ven 7.237 (L) 7.250 - 7.430   pCO2, Ven 76.2 (HH) 44.0 - 60.0 mmHg    Comment: CRITICAL RESULT CALLED TO, READ BACK BY AND VERIFIED WITH: R.LITTLE, MD AT 1406 BY M.JESTER, RRT, RCP ON 2017/04/14    pO2, Ven 42.9 32.0 - 45.0 mmHg   Bicarbonate 31.3 (H) 20.0 - 28.0 mmol/L   O2 Saturation 64.6 %   Collection site VEIN    Drawn by DRAWN BY RN    Sample type VENOUS     Comment: Performed at [facility], [address], [place], [place] [address]   Mechanical Rate PENDING   I-Stat  Troponin, ED (not at [facility])     Status: Abnormal   Collection Time: 04/14/2017  2:13 PM  Result Value Ref Range   Troponin i, poc 0.26 (HH) 0.00 - 0.08 ng/mL   Comment NOTIFIED PHYSICIAN    Comment 3            Comment: Due to the release kinetics of cTnI, a negative result within the first hours of the onset of symptoms does not rule out myocardial infarction with certainty. If myocardial infarction is still suspected, repeat the test at appropriate intervals.   I-Stat CG4 Lactic Acid, ED     Status: None   Collection Time: April 14, 2017  2:15 PM  Result Value Ref Range   Lactic Acid, Venous 1.10 0.5 - 1.9 mmol/L  I-Stat Chem 8, ED     Status: Abnormal   Collection Time: Apr 14, 2017  2:15 PM  Result Value Ref Range   Sodium 145 135 - 145 mmol/L   Potassium 3.9 3.5 - 5.1 mmol/L   Chloride 103 101 - 111 mmol/L   BUN 17 6 - 20 mg/dL   Creatinine, Ser 0.98 0.61 - 1.24 mg/dL   Glucose, Bld 119 (H) 65 - 99 mg/dL   Calcium, Ion 1.47 8.29 - 1.40 mmol/L   TCO2 33 (H) 22 - 32 mmol/L   Hemoglobin 9.2 (L) 13.0 - 17.0 g/dL   HCT 56.2 (L) 13.0 - 86.5 %  Urinalysis, Routine w reflex microscopic     Status: Abnormal   Collection Time: 04-14-2017  2:22 PM  Result Value Ref Range   Color, Urine YELLOW YELLOW   APPearance CLEAR CLEAR   Specific Gravity, Urine 1.006 1.005 - 1.030   pH 7.0 5.0 - 8.0   Glucose, UA NEGATIVE NEGATIVE mg/dL   Hgb urine dipstick NEGATIVE NEGATIVE   Bilirubin Urine NEGATIVE NEGATIVE   Ketones, ur NEGATIVE NEGATIVE mg/dL   Protein, ur NEGATIVE NEGATIVE mg/dL   Nitrite NEGATIVE NEGATIVE   Leukocytes, UA NEGATIVE NEGATIVE   RBC / HPF 0-5 0 - 5 RBC/hpf   WBC, UA 0-5 0 - 5 WBC/hpf   Bacteria, UA RARE (A) NONE SEEN   Squamous Epithelial / LPF NONE SEEN NONE SEEN    Comment: Performed at [facility], [address], [place], [place] [address]   Ct Head Wo Contrast  Result Date: Apr 14, 2017 CLINICAL DATA:  84 y/o  M; found unresponsive. EXAM: CT HEAD WITHOUT  CONTRAST TECHNIQUE: Contiguous axial  images were obtained from the base of the skull through the vertex without intravenous contrast. COMPARISON:  None. FINDINGS: Brain: Massive subdural hematoma over the right cerebral convexity measuring up to 3.7 cm in thickness (series 4, image 32). Additionally, there are several small foci of brain parenchymal hemorrhage within the pons as well as the body of corpus callosum. Edema and mass effect results in 25 mm of right-to-left midline shift. Right to left subfalcine herniation, diffuse effacement of basilar cisterns and downward transtentorial herniation. There is entrapment of the left lateral ventricle due to effacement of third and right lateral ventricles. Hypoattenuation of brain parenchyma and paramedian frontal lobe along falx may represent areas of infarction or edema. Vascular: No hyperdense vessel or unexpected calcification. Skull: Normal. Negative for fracture or focal lesion. Sinuses/Orbits: No acute finding. Other: None. IMPRESSION: 1. Massive acute subdural hemorrhage over the right cerebral convexity. Small foci of parenchymal hemorrhage and body of corpus callosum and pons. 2. Edema and mass effect with 25 mm right-to-left midline shift, right to left subfalcine herniation, and downward transtentorial herniation. Entrapment of left lateral ventricle. Critical Value/emergent results were called by telephone at the time of interpretation on 02/27/17 at 4:04 pm to Dr. Alveria ApleySOPHIA CACCAVALE , who verbally acknowledged these results. Electronically Signed   By: Mitzi HansenLance  Furusawa-Stratton M.D.   On: 002/04/19 16:08   Dg Chest Portable 1 View  Result Date: 02/27/17 CLINICAL DATA:  Found unresponsive today at facility EXAM: PORTABLE CHEST 1 VIEW COMPARISON:  03/09/2017 FINDINGS: The cardiac silhouette, mediastinal and hilar contours are within normal limits and stable. There is tortuosity of the thoracic aorta. Prominent skin folds are noted over both lungs. No  pneumothorax or pleural effusion. No infiltrates or edema. Remote healed rib fractures are noted. IMPRESSION: No acute cardiopulmonary findings. Electronically Signed   By: Rudie MeyerP.  Gallerani M.D.   On: 002/04/19 14:39    Pending Labs Unresulted Labs (From admission, onward)   Start     Ordered   July 08, 2017 1353  Blood culture (routine x 2)  BLOOD CULTURE X 2,   STAT     July 08, 2017 1352   July 08, 2017 1353  Urine culture  STAT,   STAT     July 08, 2017 1352      Vitals/Pain Today's Vitals   July 08, 2017 1615 July 08, 2017 1628 July 08, 2017 1809 July 08, 2017 1856  BP: 106/65 105/66 116/73 (!) 87/53  Pulse: 68 74 81 76  Resp: (!) 21 (!) 21 20 (!) 23  Temp: (!) 94.1 F (34.5 C)     TempSrc:      SpO2: 96% 96% 96% 95%  Weight:      Height:      PainSc:        Isolation Precautions No active isolations  Medications Medications  iopamidol (ISOVUE-370) 76 % injection (  Canceled Entry 02-20-17 1515)  piperacillin-tazobactam (ZOSYN) IVPB 3.375 g (0 g Intravenous Stopped 02-20-17 1526)    Mobility non-ambulatory

## 2017-04-09 NOTE — Progress Notes (Signed)
A consult was received from an ED physician for Vancomycin & Zosyn per pharmacy dosing.  The patient's profile has been reviewed for ht/wt/allergies/indication/available labs.   A one time order has been placed for Zosyn 3.375gm & Vanc 1500mg  IV.  Further antibiotics/pharmacy consults should be ordered by admitting physician if indicated.                       Thank you, Elson ClanLilliston, Courtni Balash Michelle 03/24/2017  2:22 PM

## 2017-04-09 NOTE — ED Provider Notes (Signed)
[facility]-EMERGENCY DEPT Provider Note   CSN: [id number] Arrival date & time: 2017/07/29  1331     History   Chief Complaint Chief Complaint  Patient presents with  . Loss of Consciousness    HPI Joe Mcpherson is a 84 y.o. male presenting for evaluation of altered mental status.  Level 5 caveat, as patient is unresponsive.  Per EMS, there are multiple varying stories.  One facility member states patient was last seen normal at 2 AM this morning.  Second facility member states patient was asleep when breakfast was delivered.  Third facility member states patient was being bathed 1.5 hrs prior to EMS arrival and was normal. Baseline is walking and talking.  No witnessed fall or trauma.  EMS reports patient has remained unresponsive throughout transport.  Per chart review patient with a history of CAD status post stent placement, and right total hip surgery February 2019.  History of dementia. ?blood thinners per chart review, may have discontinued a few days ago. Pt is DNR.    HPI  Past Medical History:  Diagnosis Date  . CAD (coronary artery disease) 03/09/2017  . Coronary artery disease   . Dementia 03/09/2017  . Hip fracture, unspecified laterality, closed, initial encounter (HCC) 03/09/2017  . HTN (hypertension) 03/09/2017  . Hypertension   . Orthostasis 03/09/2017  . S/P angioplasty with stent 03/09/2017    Patient Active Problem List   Diagnosis Date Noted  . Subdural hemorrhage (HCC) 06/09/2017  . Fall   . History of hemiarthroplasty of hip   . Closed displaced fracture of right femoral neck (HCC) 03/09/2017  . Hip fracture requiring operative repair (HCC) 03/09/2017  . CAD (coronary artery disease) 03/09/2017  . S/P angioplasty with stent 03/09/2017  . HTN (hypertension) 03/09/2017  . Dementia 03/09/2017  . SVT (supraventricular tachycardia) (HCC) 03/09/2017  . Orthostasis 03/09/2017  . Acute lower GI bleeding 02/25/2016  . Orthostatic syncope  12/27/2015    Past Surgical History:  Procedure Laterality Date  . CORONARY STENT PLACEMENT    . HIP ARTHROPLASTY Right 03/10/2017   Procedure: ARTHROPLASTY BIPOLAR HIP (HEMIARTHROPLASTY);  Surgeon: Sheral ApleyMurphy, Timothy D, MD;  [facility];  Service: Orthopedics;  Laterality: Right;       Home Medications    Prior to Admission medications   Medication Sig Start Date End Date Taking? Authorizing Provider  acetaminophen (TYLENOL) 325 MG tablet Take 2 tablets (650 mg total) by mouth every 6 (six) hours as needed for mild pain (or Fever >/= 101). 03/20/17  Yes Zannie CoveJoseph, Preetha, MD  acetaminophen (TYLENOL) 325 MG tablet Take 650 mg by mouth 2 (two) times daily.   Yes [provider]  aspirin EC 81 MG tablet Take 81 mg by mouth daily.   Yes [provider]  enoxaparin (LOVENOX) 40 MG/0.4ML injection Inject 0.4 mLs (40 mg total) into the skin daily for 30 doses. For 30 days post op for DVT prophylaxis 03/10/17 2017/07/29 Yes Albina BilletMartensen, Henry Calvin III, PA-C  ferrous sulfate 325 (65 FE) MG tablet Take 325 mg by mouth 2 (two) times daily with a meal.   Yes [provider]  fludrocortisone (FLORINEF) 0.1 MG tablet Take 0.2 mg by mouth daily.    Yes [provider]  Melatonin 3 MG TABS Take 3 mg by mouth at bedtime.   Yes [provider]  memantine (NAMENDA) 5 MG tablet Take 5 mg by mouth at bedtime.   Yes [provider]  mirtazapine (REMERON) 15 MG tablet  Take 15 mg by mouth at bedtime.   Yes [provider]  polyethylene glycol (MIRALAX / GLYCOLAX) packet Take 17 g by mouth daily as needed for mild constipation. 03/20/17  Yes Zannie Cove, MD  potassium chloride (K-DUR) 10 MEQ tablet Take 10 mEq by mouth daily.   Yes [provider]  vitamin C (ASCORBIC ACID) 500 MG tablet Take 500 mg by mouth every evening.   Yes [provider]  diltiazem (CARDIZEM) 60 MG tablet Take 1 tablet (60 mg total) by mouth once. Patient not  taking: Reported on 05-08-17 12/22/11   Sunnie Nielsen, MD  HYDROcodone-acetaminophen [facility]) 5-325 MG tablet Take 1 tablet by mouth every 6 (six) hours as needed for moderate pain. Patient not taking: Reported on 05-08-17 03/10/17   Albina Billet III, PA-C    Family History No family history on file.  Social History Social History   Tobacco Use  . Smoking status: Unknown If Ever Smoked  Substance Use Topics  . Alcohol use: Not on file  . Drug use: Not on file     Allergies   Patient has no known allergies.   Review of Systems Review of Systems  Unable to perform ROS: Patient unresponsive     Physical Exam Updated Vital Signs BP 105/66 (BP Location: Left Arm)   Pulse 74   Temp (!) 94.1 F (34.5 C)   Resp (!) 21   Ht 6\' 2"  (1.88 m)   Wt 85.3 kg (188 lb)   SpO2 96%   BMI 24.14 kg/m   Physical Exam  Constitutional:  Patient appears cachectic.  Is unresponsive.  HENT:  Head: Normocephalic and atraumatic.  Eyes:  Pupils are unresponsive bilaterally.  Cardiovascular: Regular rhythm and intact distal pulses.  Mildly tachycardic ~110  Pulmonary/Chest:  Snoring respirations with accessory muscle use.  Abdominal: Soft. He exhibits no distension and no mass.  Bladder palpably distended.  Abdomen sunken.  Musculoskeletal:  No movement of extremities.  Radial and pedal pulses intact bilaterally.  Neurological: GCS eye subscore is 1. GCS verbal subscore is 1. GCS motor subscore is 1.  GCS of 3  Skin: Skin is dry.  Nursing note and vitals reviewed.    ED Treatments / Results  Labs (all labs ordered are listed, but only abnormal results are displayed) Labs Reviewed  CBC WITH DIFFERENTIAL/PLATELET - Abnormal; Notable for the following components:      Result Value   WBC 10.9 (*)    RBC 2.83 (*)    Hemoglobin 8.8 (*)    HCT 28.6 (*)    MCV 101.1 (*)    RDW 15.6 (*)    Neutro Abs 9.0 (*)    Lymphs Abs 0.5 (*)    Monocytes Absolute 1.4 (*)    All  other components within normal limits  URINALYSIS, ROUTINE W REFLEX MICROSCOPIC - Abnormal; Notable for the following components:   Bacteria, UA RARE (*)    All other components within normal limits  BLOOD GAS, VENOUS - Abnormal; Notable for the following components:   pH, Ven 7.237 (*)    pCO2, Ven 76.2 (*)    Bicarbonate 31.3 (*)    All other components within normal limits  I-STAT TROPONIN, ED - Abnormal; Notable for the following components:   Troponin i, poc 0.26 (*)    All other components within normal limits  I-STAT CHEM 8, ED - Abnormal; Notable for the following components:   Glucose, Bld 179 (*)    TCO2 33 (*)  Hemoglobin 9.2 (*)    HCT 27.0 (*)    All other components within normal limits  CULTURE, BLOOD (ROUTINE X 2)  CULTURE, BLOOD (ROUTINE X 2)  URINE CULTURE  I-STAT CG4 LACTIC ACID, ED    EKG  EKG Interpretation  Date/Time:  Sunday 04-17-2017 13:57:23 EDT Ventricular Rate:  103 PR Interval:    QRS Duration: 77 QT Interval:  371 QTC Calculation: 486 R Axis:   44 Text Interpretation:  Sinus tachycardia Probable left atrial enlargement RSR' in V1 or V2, right VCD or RVH Borderline T abnormalities, anterior leads Borderline prolonged QT interval similar to previous Confirmed by Frederick Peers [phone]) on Apr 17, 2017 2:40:32 PM       Radiology Ct Head Wo Contrast  Result Date: 2017-04-17 CLINICAL DATA:  84 y/o  M; found unresponsive. EXAM: CT HEAD WITHOUT CONTRAST TECHNIQUE: Contiguous axial images were obtained from the base of the skull through the vertex without intravenous contrast. COMPARISON:  None. FINDINGS: Brain: Massive subdural hematoma over the right cerebral convexity measuring up to 3.7 cm in thickness (series 4, image 32). Additionally, there are several small foci of brain parenchymal hemorrhage within the pons as well as the body of corpus callosum. Edema and mass effect results in 25 mm of right-to-left midline shift. Right to left subfalcine  herniation, diffuse effacement of basilar cisterns and downward transtentorial herniation. There is entrapment of the left lateral ventricle due to effacement of third and right lateral ventricles. Hypoattenuation of brain parenchyma and paramedian frontal lobe along falx may represent areas of infarction or edema. Vascular: No hyperdense vessel or unexpected calcification. Skull: Normal. Negative for fracture or focal lesion. Sinuses/Orbits: No acute finding. Other: None. IMPRESSION: 1. Massive acute subdural hemorrhage over the right cerebral convexity. Small foci of parenchymal hemorrhage and body of corpus callosum and pons. 2. Edema and mass effect with 25 mm right-to-left midline shift, right to left subfalcine herniation, and downward transtentorial herniation. Entrapment of left lateral ventricle. Critical Value/emergent results were called by telephone at the time of interpretation on 04-17-17 at 4:04 pm to Dr. Alveria Apley , who verbally acknowledged these results. Electronically Signed   By: Mitzi Hansen M.D.   On: 2017/04/17 16:08   Dg Chest Portable 1 View  Result Date: Apr 17, 2017 CLINICAL DATA:  Found unresponsive today at facility EXAM: PORTABLE CHEST 1 VIEW COMPARISON:  03/09/2017 FINDINGS: The cardiac silhouette, mediastinal and hilar contours are within normal limits and stable. There is tortuosity of the thoracic aorta. Prominent skin folds are noted over both lungs. No pneumothorax or pleural effusion. No infiltrates or edema. Remote healed rib fractures are noted. IMPRESSION: No acute cardiopulmonary findings. Electronically Signed   By: Rudie Meyer M.D.   On: 2017-04-17 14:39    Procedures .Critical Care Performed by: Alveria Apley, PA-C Authorized by: Alveria Apley, PA-C   Critical care provider statement:    Critical care time (minutes):  50   Critical care time was exclusive of:  Separately billable procedures and treating other patients and teaching  time   Critical care was necessary to treat or prevent imminent or life-threatening deterioration of the following conditions:  CNS failure or compromise and respiratory failure   Critical care was time spent personally by me on the following activities:  Development of treatment plan with patient or surrogate, discussions with consultants, evaluation of patient's response to treatment, examination of patient, review of old charts, re-evaluation of patient's condition, pulse oximetry, obtaining history from patient or surrogate, ordering  and review of radiographic studies, ordering and review of laboratory studies and ordering and performing treatments and interventions   I assumed direction of critical care for this patient from another provider in my specialty: no   Comments:     Pt presenting unresponsive. Hypercarbic and BiPAP initiated. Found to have life-threatening, massive subdural head bleed.    (including critical care time)  Medications Ordered in ED Medications  iopamidol (ISOVUE-370) 76 % injection (  Canceled Entry 04/15/2017 1515)  piperacillin-tazobactam (ZOSYN) IVPB 3.375 g (0 g Intravenous Stopped 03/24/2017 1526)     Initial Impression / Assessment and Plan / ED Course  I have reviewed the triage vital signs and the nursing notes.  Pertinent labs & imaging results that were available during my care of the patient were reviewed by me and considered in my medical decision making (see chart for details).     Pt presenting unresponsive and with snoring respirations. Concerning exam due to no pupillary response and AMS. Baseline walking and talking. No known injury. Will obtain labs, cxr, and start bipap. Dr. Clarene Duke evaluated the pt.   vbg shows hypercarbia. Lactate normal. Trop mildly elevated, EKG without signs of stemi. Pt mental status unchanged on bipap. BP decreasing, started 2 L IVF and placed in trendelenburg. IV abx started for possible sepsis. CXR viewed by me, no sign of  PNA or cause of sxs.    BP improved without neosyn push. Hgb stable. Will obtain head and abd CT, and CTA to r/o PE.   Called to CT to see head results. Shows large bleed which is likely life-threatening. Dr. Clarene Duke discussed findings with pt's niece, who is also POA. Per niece, pt had 2 recent falls over the past several days, likely cause of bleed. Pt has DNR and MOST, and niece does not want pursue further tx or management. Will d/c tx and admit for comfort care. Niece is in [place], and will drive to [place].   Discussed with hospitalist, pt to be admitted.    Final Clinical Impressions(s) / ED Diagnoses   Final diagnoses:  Subdural bleeding [facility])  End of life care    ED Discharge Orders    None       Alveria Apley, PA-C 04/08/2017 1647    Little, Ambrose Finland, MD 04/09/17 [phone]

## 2017-04-10 ENCOUNTER — Other Ambulatory Visit: Payer: Self-pay

## 2017-04-10 DIAGNOSIS — L8990 Pressure ulcer of unspecified site, unspecified stage: Secondary | ICD-10-CM

## 2017-04-10 LAB — MRSA PCR SCREENING: MRSA by PCR: NEGATIVE

## 2017-04-10 MED ORDER — HYDROMORPHONE HCL 1 MG/ML IJ SOLN: 0.5000 mg | INTRAMUSCULAR | Status: DC | PRN

## 2017-04-10 MED ORDER — LORAZEPAM 2 MG/ML IJ SOLN: 2.0000 mg | INTRAMUSCULAR | Status: DC | PRN

## 2017-04-10 MED ORDER — ORAL CARE MOUTH RINSE: 15.0000 mL | Freq: Two times a day (BID) | OROMUCOSAL | Status: DC

## 2017-04-10 MED ORDER — CHLORHEXIDINE GLUCONATE 0.12 % MT SOLN: 15.0000 mL | Freq: Two times a day (BID) | OROMUCOSAL | Status: DC

## 2017-04-11 LAB — URINE CULTURE

## 2017-04-14 LAB — CULTURE, BLOOD (ROUTINE X 2)
CULTURE: NO GROWTH
Culture: NO GROWTH
SPECIAL REQUESTS: ADEQUATE
Special Requests: ADEQUATE

## 2017-04-24 NOTE — Progress Notes (Signed)
Patient was admitted from ED around 8 pm. He is non-responsive and unable to answer admission questions.  No family at bedside. He is full comfort care and possible inpatient death.  Will monitor.Priscille KluverLineback, Vash Quezada M

## 2017-04-24 NOTE — Discharge Summary (Addendum)
Death Summary  Joe SignsRobert Farish ZOX:[id number] DOB: 10/28/1924 DOA: 28-May-2017  PCP: Default, Provider, MD   Admit date: 28-May-2017 Date of Death: 03/26/2017  Final Diagnoses:  Active Problems:   Subdural hemorrhage (HCC)   Pressure injury of skin comfort measures     History of present illness:  Joe Mcpherson is a 84 y.o. male with medical history significant of recent. Hip repair, CAD, dementia, hypertension. Dementia, ws brought in from SNF for unresponsiveness. On arrival to ED, pt is hypoxic, requiring 100% oxygen and put on BIPAP and he underwent ct head showing massive sub dural hemorrhage. EDP physician spoke to the POA, the niece , wanted comfort measures for the patient.  PATIENT PASSED AWAY In the early hours of 3/ 18/19.          Signed:  Kathlen ModyVijaya Wilhemenia Camba  [facility] 04/17/2017,.

## 2017-04-24 NOTE — Progress Notes (Signed)
Nutrition Brief Note  Patient identified via MST score.  Chart reviewed. Pt goals are comfort care.  No nutrition interventions warranted at this time.   Tilda FrancoLindsey Sharren Schnurr, MS, RD, LDN [facility] Clinical Dietitian Pager: [phone] After Hours Pager: [phone]

## 2017-04-24 DEATH — deceased

## 2017-06-04 LAB — BLOOD GAS, VENOUS
Bicarbonate: 31.3 mmol/L — ABNORMAL HIGH (ref 20.0–28.0)
O2 SAT: 64.6 %
PCO2 VEN: 76.2 mmHg — AB (ref 44.0–60.0)
PH VEN: 7.237 — AB (ref 7.250–7.430)
PO2 VEN: 42.9 mmHg (ref 32.0–45.0)

## 2019-07-14 IMAGING — CR DG CHEST 1V
2 series · 2 of 2 positions shown · non-contrast
Comparison: Portable chest x-ray December 22, 2011

CLINICAL DATA: The patient slipped and fell today and has diffuse
right hip pain.

EXAM:
CHEST 1 VIEW

[x chest ap (1 of 2)]
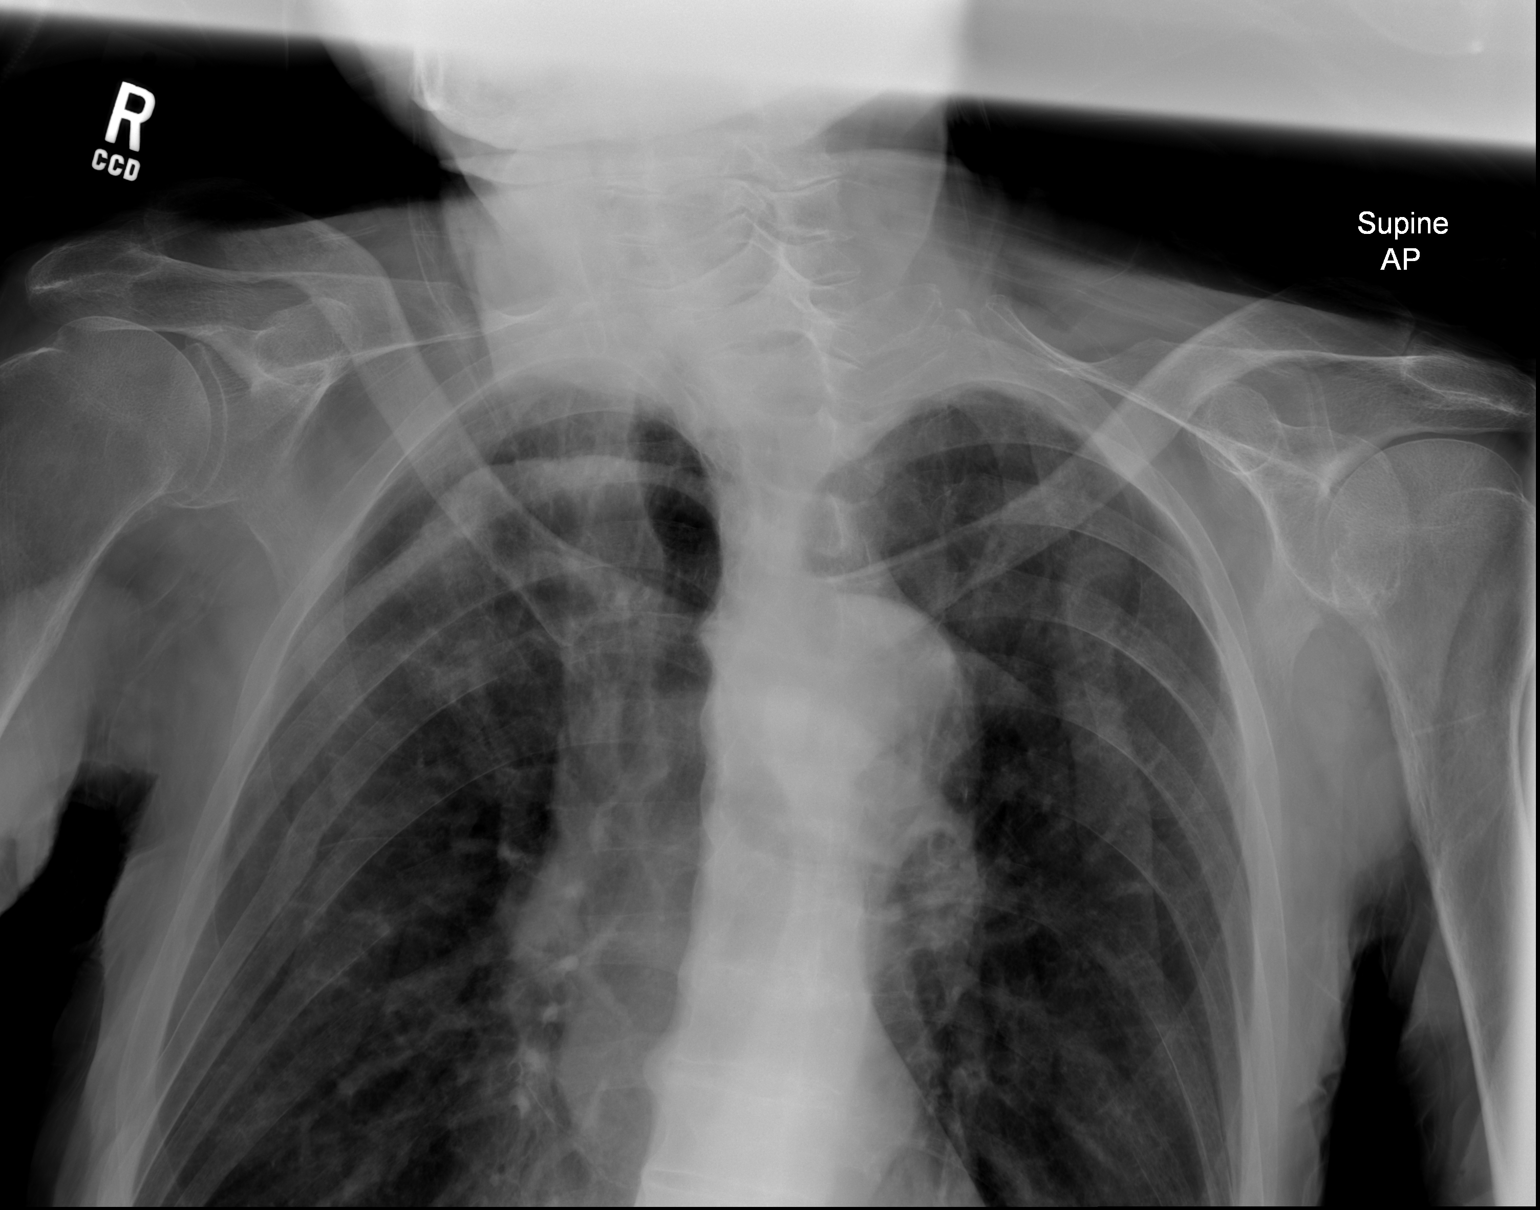

[x chest ap (2 of 2)]
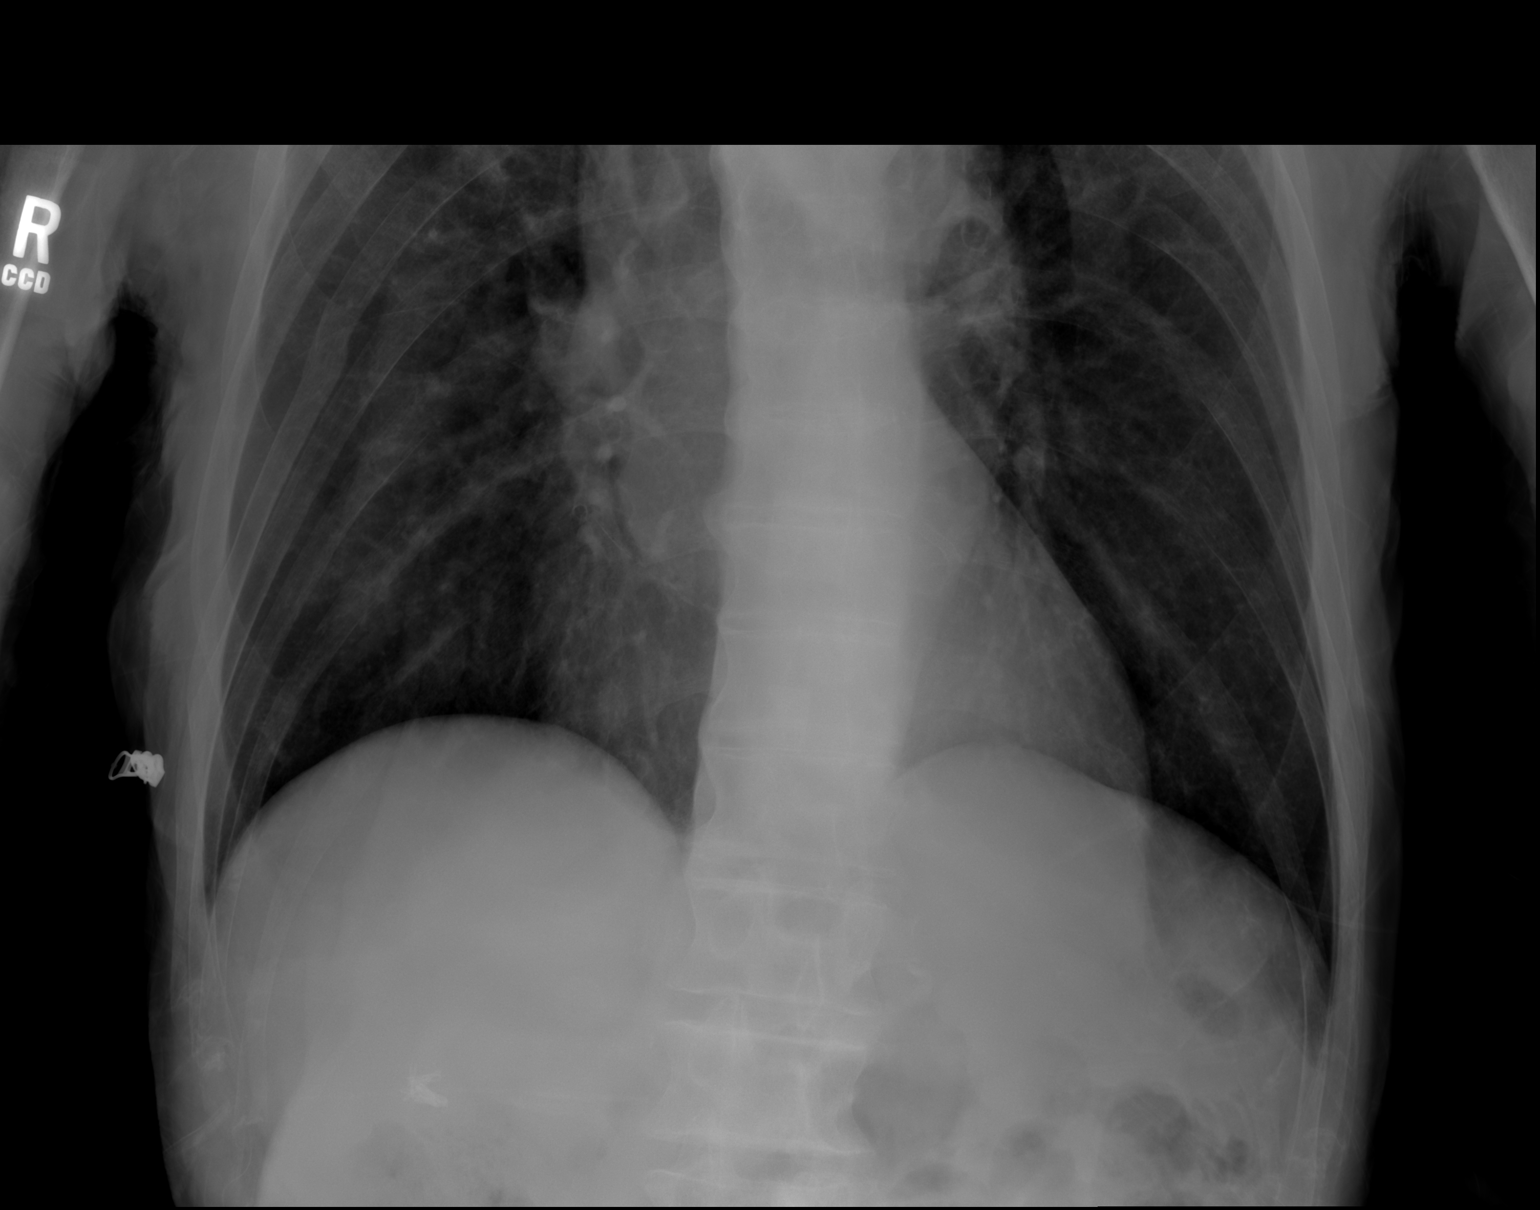

[2 of 2 positions shown; findings below may reference images not displayed]

FINDINGS: The lungs are hyperinflated. There is no focal infiltrate. The lung
markings are increased however in the right upper lobe. There is
scleroses of the posterior aspect of the right fourth rib which has
progressed since the previous study. The heart is top-normal in
size. The pulmonary vascularity is normal. There tortuosity of the
descending thoracic aorta. No acute rib fracture is observed.
IMPRESSION: COPD. Slightly increased right upper lobe lung markings as compared
to the previous study may reflect atelectasis or early pneumonia. No
CHF.

Increased density within the posterior aspect of the right fourth
rib.

Thoracic aortic atherosclerosis.

## 2019-07-22 IMAGING — RF DG SWALLOWING FUNCTION - [facility]
1 series · 18 of 24 positions shown · non-contrast
Comparison: none

[Series 1: run · 31 acquisitions, 18 frames shown]
[im 1/31]
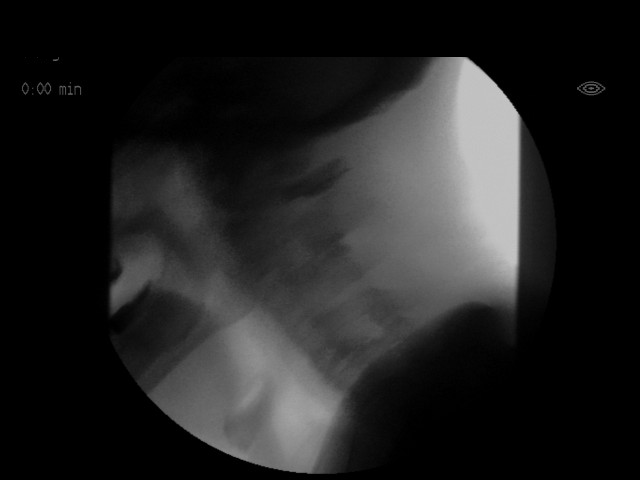
[im 3/31]
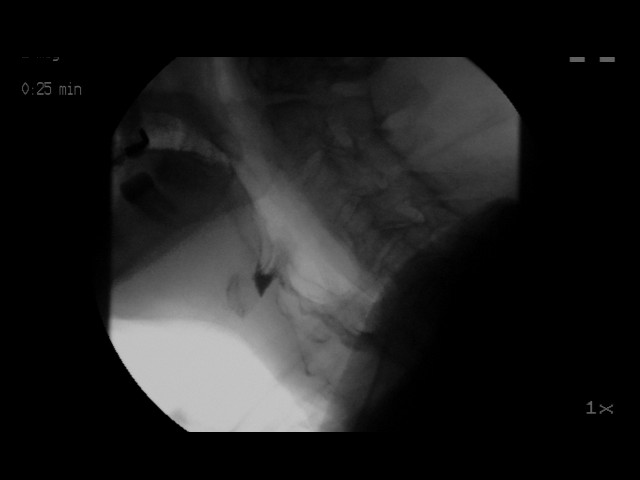
[im 4/31]
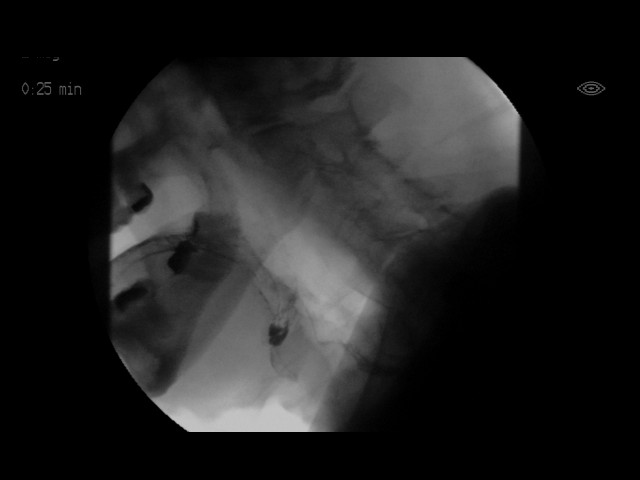
[im 6/31]
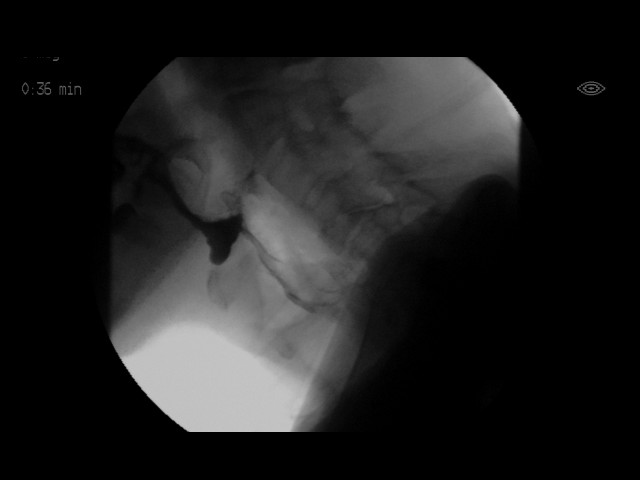
[im 8/31]
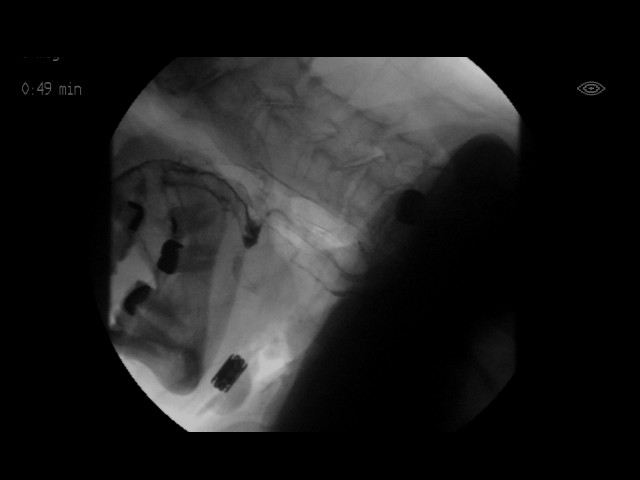
[im 10/31]
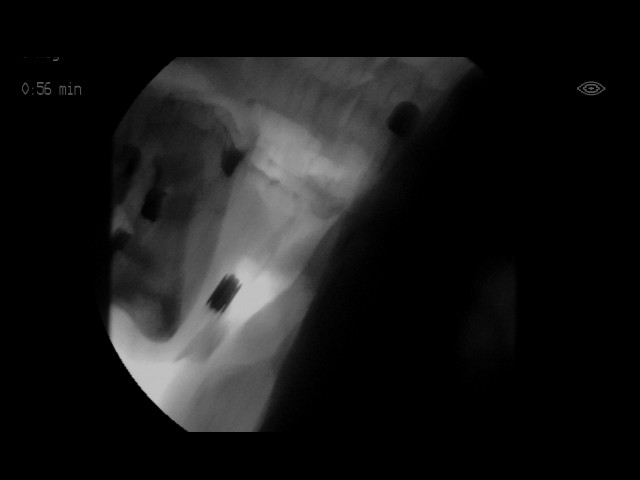
[im 11/31]
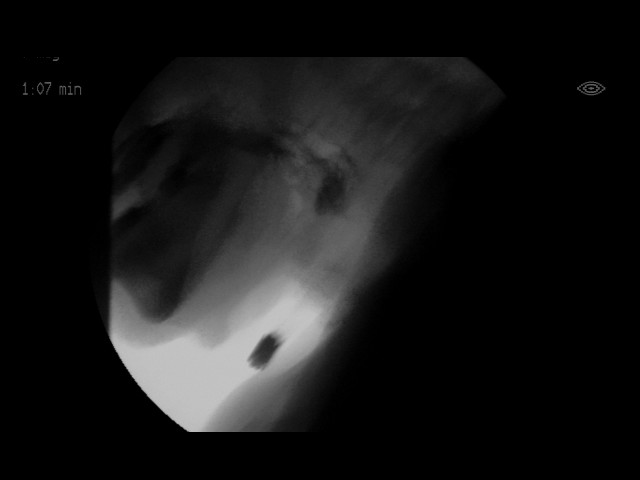
[im 14/31]
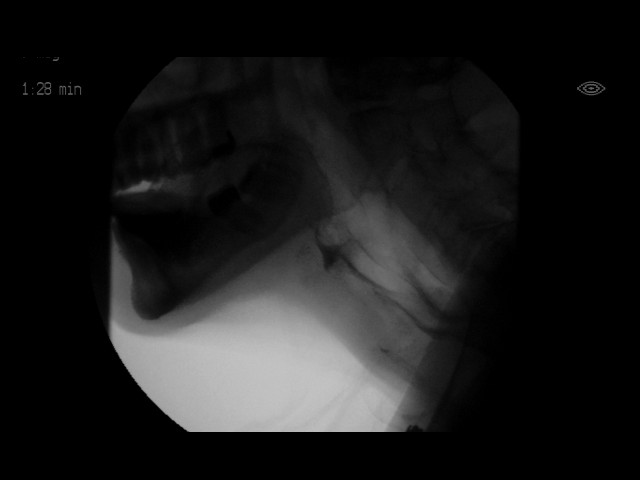
[im 15/31]
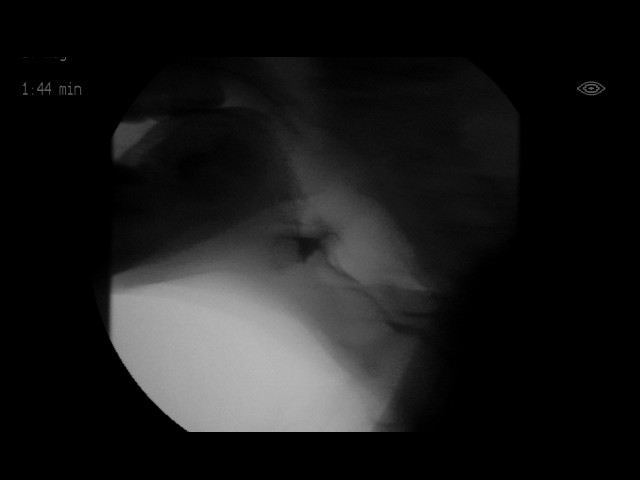
[im 16/31]
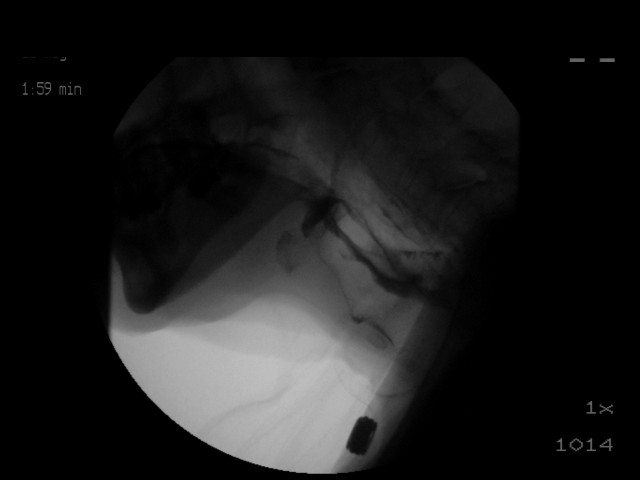
[im 19/31]
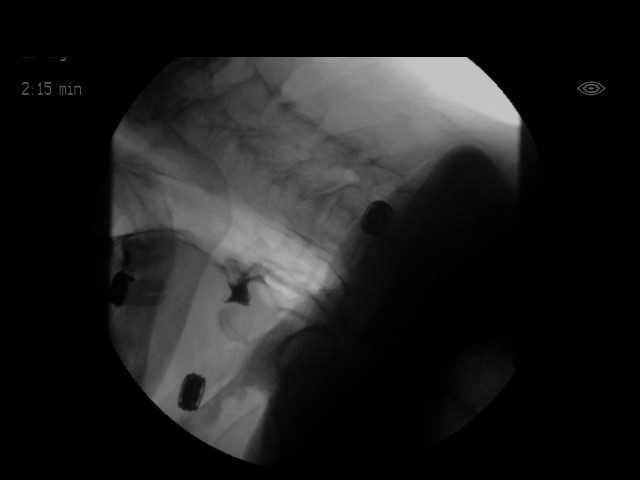
[im 20/31]
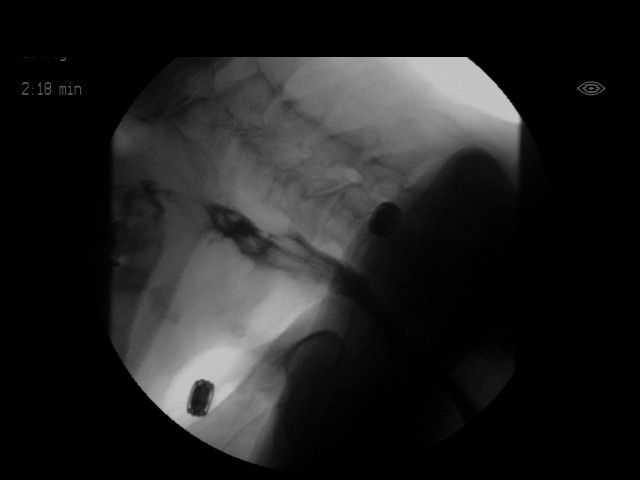
[im 21/31]
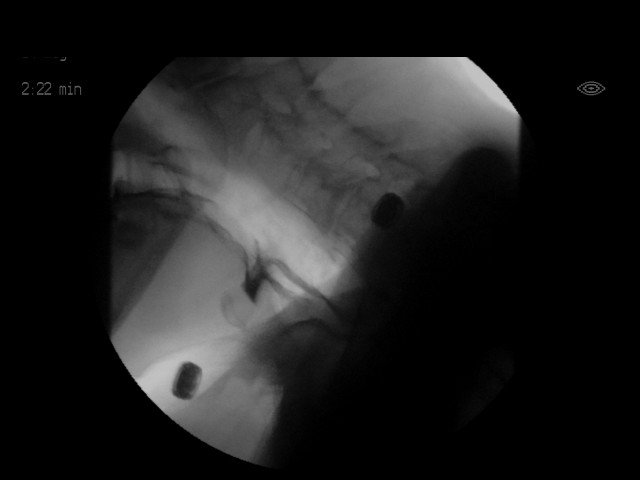
[im 24/31]
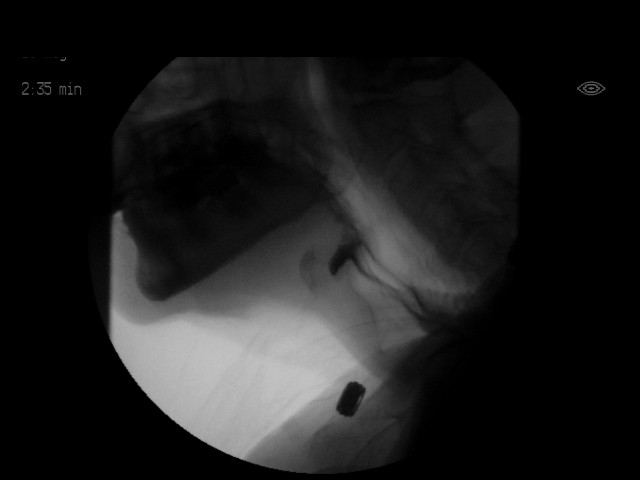
[im 25/31]
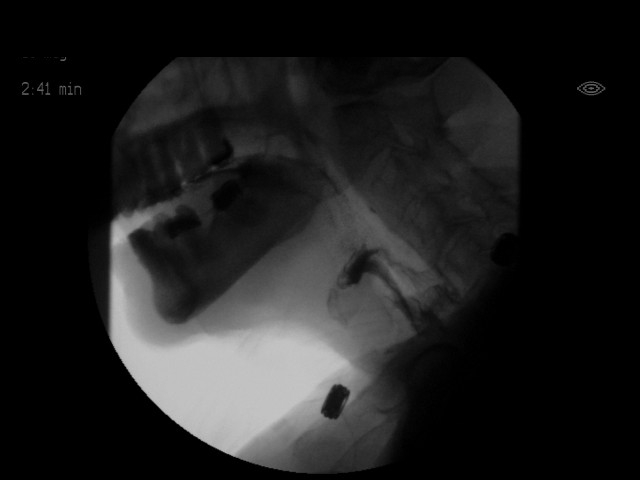
[im 27/31]
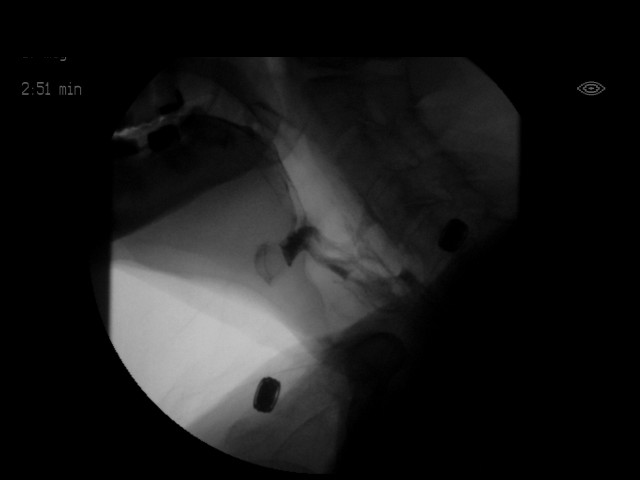
[im 29/31]
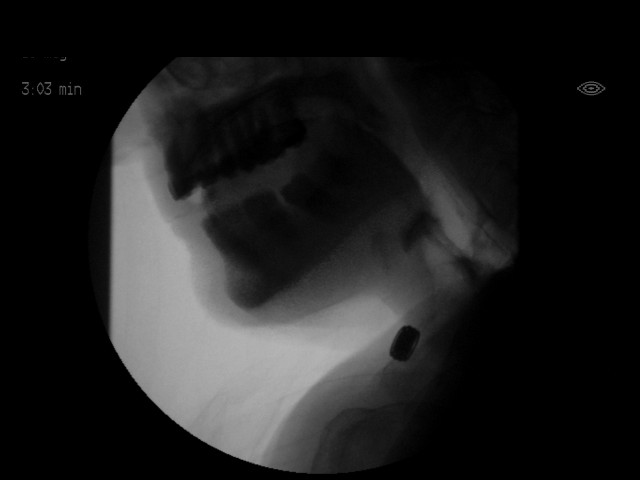
[im 31/31]
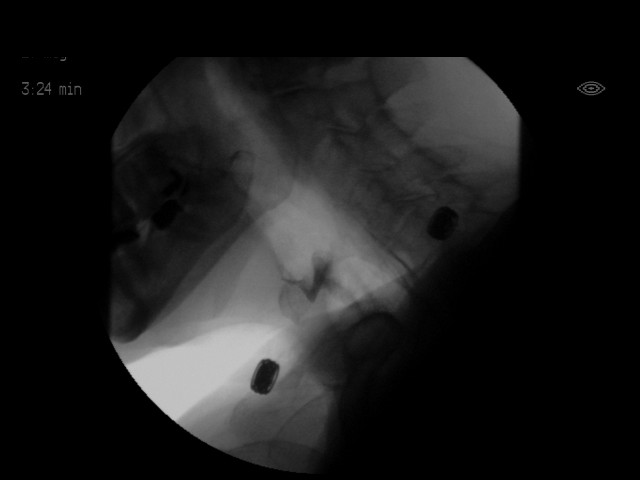

[18 of 24 positions shown; findings below may reference images not displayed]

FLUOROSCOPY FOR SWALLOWING FUNCTION STUDY:
Fluoroscopy was provided for swallowing function study, which was administered by a speech pathologist.  Final results and recommendations from this study are contained within the speech pathology report.
